# Patient Record
Sex: Female | Born: 1980 | Hispanic: Yes | Marital: Single | Smoking: Never smoker
Health system: Southern US, Community
[De-identification: ages and names within clinical notes are randomized; demographics above are authoritative.]

## PROBLEM LIST (undated history)

## (undated) DIAGNOSIS — I82419 Acute embolism and thrombosis of unspecified femoral vein: Secondary | ICD-10-CM

## (undated) DIAGNOSIS — I82602 Acute embolism and thrombosis of unspecified veins of left upper extremity: Secondary | ICD-10-CM

## (undated) HISTORY — PX: ABDOMINOPLASTY: SUR9

## (undated) HISTORY — PX: BACK SURGERY: SHX140

---

## 2020-01-25 ENCOUNTER — Emergency Department (HOSPITAL_COMMUNITY)
Admission: EM | Admit: 2020-01-25 | Discharge: 2020-01-25 | Disposition: A | Discharge status: Home / Self Care | Payer: Self-pay | Attending: Emergency Medicine | Admitting: Emergency Medicine

## 2020-01-25 ENCOUNTER — Encounter (HOSPITAL_COMMUNITY): Payer: Self-pay

## 2020-01-25 ENCOUNTER — Emergency Department (HOSPITAL_COMMUNITY): Payer: Self-pay

## 2020-01-25 ENCOUNTER — Other Ambulatory Visit: Payer: Self-pay

## 2020-01-25 DIAGNOSIS — R109 Unspecified abdominal pain: Secondary | ICD-10-CM | POA: Insufficient documentation

## 2020-01-25 DIAGNOSIS — Z91040 Latex allergy status: Secondary | ICD-10-CM | POA: Insufficient documentation

## 2020-01-25 DIAGNOSIS — Z20822 Contact with and (suspected) exposure to covid-19: Secondary | ICD-10-CM | POA: Insufficient documentation

## 2020-01-25 DIAGNOSIS — R112 Nausea with vomiting, unspecified: Secondary | ICD-10-CM | POA: Insufficient documentation

## 2020-01-25 DIAGNOSIS — R52 Pain, unspecified: Secondary | ICD-10-CM

## 2020-01-25 DIAGNOSIS — R5383 Other fatigue: Secondary | ICD-10-CM | POA: Insufficient documentation

## 2020-01-25 DIAGNOSIS — R6883 Chills (without fever): Secondary | ICD-10-CM | POA: Insufficient documentation

## 2020-01-25 LAB — CBC
HCT: 38.6 % (ref 36.0–46.0)
Hemoglobin: 12.3 g/dL (ref 12.0–15.0)
MCH: 29.1 pg (ref 26.0–34.0)
MCHC: 31.9 g/dL (ref 30.0–36.0)
MCV: 91.5 fL (ref 80.0–100.0)
Platelets: 241 10*3/uL (ref 150–400)
RBC: 4.22 MIL/uL (ref 3.87–5.11)
RDW: 13.9 % (ref 11.5–15.5)
WBC: 3.6 10*3/uL — ABNORMAL LOW (ref 4.0–10.5)
nRBC: 0 % (ref 0.0–0.2)

## 2020-01-25 LAB — URINALYSIS, ROUTINE W REFLEX MICROSCOPIC
Bacteria, UA: NONE SEEN
Bilirubin Urine: NEGATIVE
Glucose, UA: NEGATIVE mg/dL
Ketones, ur: NEGATIVE mg/dL
Leukocytes,Ua: NEGATIVE
Nitrite: NEGATIVE
Protein, ur: 100 mg/dL — AB
RBC / HPF: >50 RBC/hpf — ABNORMAL HIGH (ref 0–5)
Specific Gravity, Urine: 1.024 (ref 1.005–1.030)
pH: 6 (ref 5.0–8.0)

## 2020-01-25 LAB — COMPREHENSIVE METABOLIC PANEL
ALT: 20 U/L (ref 0–44)
AST: 24 U/L (ref 15–41)
Albumin: 3.7 g/dL (ref 3.5–5.0)
Alkaline Phosphatase: 77 U/L (ref 38–126)
Anion gap: 11 (ref 5–15)
BUN: 16 mg/dL (ref 6–20)
CO2: 24 mmol/L (ref 22–32)
Calcium: 9.2 mg/dL (ref 8.9–10.3)
Chloride: 104 mmol/L (ref 98–111)
Creatinine, Ser: 0.86 mg/dL (ref 0.44–1.00)
GFR, Estimated: >60 mL/min (ref >60)
Glucose, Bld: 131 mg/dL — ABNORMAL HIGH (ref 70–99)
Potassium: 4.1 mmol/L (ref 3.5–5.1)
Sodium: 139 mmol/L (ref 135–145)
Total Bilirubin: 0.7 mg/dL (ref 0.3–1.2)
Total Protein: 7 g/dL (ref 6.5–8.1)

## 2020-01-25 LAB — I-STAT BETA HCG BLOOD, ED (MC, WL, AP ONLY): I-stat hCG, quantitative: <5 m[IU]/mL (ref <5)

## 2020-01-25 LAB — RESPIRATORY PANEL BY RT PCR (FLU A&B, COVID)
Influenza A by PCR: NEGATIVE
Influenza B by PCR: NEGATIVE
SARS Coronavirus 2 by RT PCR: NEGATIVE

## 2020-01-25 MED ORDER — HYDROMORPHONE HCL 1 MG/ML IJ SOLN
1.0000 mg | Freq: Once | INTRAMUSCULAR | Status: AC
  Administered 2020-01-25: 1 mg via INTRAVENOUS
  Filled 2020-01-25: qty 1

## 2020-01-25 MED ORDER — SODIUM CHLORIDE 0.9 % IV BOLUS
1000.0000 mL | Freq: Once | INTRAVENOUS | Status: AC
  Administered 2020-01-25: 1000 mL via INTRAVENOUS

## 2020-01-25 MED ORDER — OXYCODONE-ACETAMINOPHEN 5-325 MG PO TABS
1.0000 | ORAL_TABLET | Freq: Once | ORAL | Status: AC
  Administered 2020-01-25: 1 via ORAL
  Filled 2020-01-25: qty 1

## 2020-01-25 MED ORDER — MORPHINE SULFATE (PF) 4 MG/ML IV SOLN
4.0000 mg | Freq: Once | INTRAVENOUS | Status: DC
  Filled 2020-01-25: qty 1

## 2020-01-25 MED ORDER — PROMETHAZINE HCL 25 MG PO TABS: 25.0000 mg | ORAL_TABLET | Freq: Four times a day (QID) | ORAL | 0 refills | Status: DC | PRN

## 2020-01-25 MED ORDER — DIPHENHYDRAMINE HCL 50 MG/ML IJ SOLN
INTRAMUSCULAR | Status: AC
  Filled 2020-01-25: qty 1

## 2020-01-25 MED ORDER — MORPHINE SULFATE (PF) 4 MG/ML IV SOLN
4.0000 mg | Freq: Once | INTRAVENOUS | Status: AC
  Administered 2020-01-25: 4 mg via INTRAVENOUS

## 2020-01-25 MED ORDER — DROPERIDOL 2.5 MG/ML IJ SOLN
1.2500 mg | Freq: Once | INTRAMUSCULAR | Status: DC
  Filled 2020-01-25 (×2): qty 2

## 2020-01-25 MED ORDER — PROMETHAZINE HCL 25 MG/ML IJ SOLN
12.5000 mg | Freq: Once | INTRAMUSCULAR | Status: AC
  Administered 2020-01-25: 12.5 mg via INTRAVENOUS
  Filled 2020-01-25: qty 1

## 2020-01-25 MED ORDER — ONDANSETRON 4 MG PO TBDP
4.0000 mg | ORAL_TABLET | Freq: Once | ORAL | Status: AC
  Administered 2020-01-25: 4 mg via ORAL
  Filled 2020-01-25: qty 1

## 2020-01-25 MED ORDER — DIPHENHYDRAMINE HCL 50 MG/ML IJ SOLN
25.0000 mg | Freq: Once | INTRAMUSCULAR | Status: AC
  Administered 2020-01-25: 25 mg via INTRAVENOUS

## 2020-01-25 MED ORDER — HYDROMORPHONE HCL 1 MG/ML IJ SOLN
0.5000 mg | Freq: Once | INTRAMUSCULAR | Status: AC
  Administered 2020-01-25: 0.5 mg via INTRAVENOUS
  Filled 2020-01-25: qty 1

## 2020-01-25 MED ORDER — FENTANYL CITRATE (PF) 100 MCG/2ML IJ SOLN
50.0000 ug | Freq: Once | INTRAMUSCULAR | Status: AC
  Administered 2020-01-25: 50 ug via INTRAVENOUS
  Filled 2020-01-25: qty 2

## 2020-01-25 MED ORDER — DIPHENHYDRAMINE HCL 50 MG/ML IJ SOLN
12.5000 mg | Freq: Once | INTRAMUSCULAR | Status: AC
  Administered 2020-01-25: 12.5 mg via INTRAVENOUS
  Filled 2020-01-25: qty 1

## 2020-01-25 MED ORDER — ONDANSETRON HCL 4 MG/2ML IJ SOLN
4.0000 mg | Freq: Once | INTRAMUSCULAR | Status: AC
  Administered 2020-01-25: 4 mg via INTRAVENOUS
  Filled 2020-01-25: qty 2

## 2020-01-25 NOTE — ED Notes (Addendum)
Pt noted to have taken off her BP cuff again. BP cuff placed back on arm. Educated pt on need to keep BP cuff on to appropriately monitor pt

## 2020-01-25 NOTE — ED Notes (Signed)
Pt transported to US

## 2020-01-25 NOTE — ED Notes (Signed)
Pt transported to CT ?

## 2020-01-25 NOTE — ED Provider Notes (Signed)
[facility] EMERGENCY DEPARTMENT Provider Note   CSN: [id number] Arrival date & time: 01/25/20  1353     History Chief Complaint  Patient presents with  . Flank Pain  . Nausea  . Emesis    Amanda Bradley is a 39 y.o. female.  Presents to ER with concern for flank pain.  Patient reports that yesterday she noted some chills, fatigue but thought this was related to receiving her Covid vaccine yesterday.  This morning she woke up with some right flank pain, seems to have been steadily progressing throughout the day.  Pain is intermittent, no alleviating or aggravating factors.  Radiates from right flank to right lower abdomen.  Also noted some blood in her urine, increased frequency and urgency.  No dysuria.  Also having multiple episodes of vomiting, nonbloody nonbilious.  No fevers.  Denies prior abdominal surgery.  No vaginal discharge, vaginal bleeding.  No longer has regular periods.  HPI     History reviewed. No pertinent past medical history.  There are no problems to display for this patient.   Past Surgical History:  Procedure Laterality Date  . BACK SURGERY       OB History   No obstetric history on file.     No family history on file.  Social History   Tobacco Use  . Smoking status: Never Smoker  . Smokeless tobacco: Never Used  Substance Use Topics  . Alcohol use: Never  . Drug use: Never    Home Medications Prior to Admission medications   Medication Sig Start Date End Date Taking? Authorizing Provider  promethazine (PHENERGAN) 25 MG tablet Take 1 tablet (25 mg total) by mouth every 6 (six) hours as needed for nausea or vomiting. 01/25/20   Milagros Loll, MD    Allergies    Latex, Nsaids, and Other  Review of Systems   Review of Systems  Constitutional: Negative for chills and fever.  HENT: Negative for ear pain and sore throat.   Eyes: Negative for pain and visual disturbance.  Respiratory: Negative for cough and shortness  of breath.   Cardiovascular: Negative for chest pain and palpitations.  Gastrointestinal: Positive for abdominal pain. Negative for vomiting.  Genitourinary: Positive for flank pain and frequency. Negative for dysuria and hematuria.  Musculoskeletal: Negative for arthralgias and back pain.  Skin: Negative for color change and rash.  Neurological: Negative for seizures and syncope.  All other systems reviewed and are negative.   Physical Exam Updated Vital Signs BP 124/71   Pulse (!) 103   Temp 99.3 F (37.4 C) (Oral)   Resp 18   Ht 5\' 7"  (1.702 m)   Wt 90.7 kg   SpO2 96%   BMI 31.32 kg/m   Physical Exam Vitals and nursing note reviewed.  Constitutional:      General: She is not in acute distress.    Appearance: She is well-developed.     Comments: Appears uncomfortable secondary to pain  HENT:     Head: Normocephalic and atraumatic.  Eyes:     Conjunctiva/sclera: Conjunctivae normal.  Cardiovascular:     Rate and Rhythm: Normal rate and regular rhythm.     Heart sounds: No murmur heard.   Pulmonary:     Effort: Pulmonary effort is normal. No respiratory distress.     Breath sounds: Normal breath sounds.  Abdominal:     Palpations: Abdomen is soft.     Tenderness: There is no abdominal tenderness.  Musculoskeletal:  Cervical back: Neck supple.  Skin:    General: Skin is warm and dry.  Neurological:     Mental Status: She is alert.     ED Results / Procedures / Treatments   Labs (all labs ordered are listed, but only abnormal results are displayed) Labs Reviewed  COMPREHENSIVE METABOLIC PANEL - Abnormal; Notable for the following components:      Result Value   Glucose, Bld 131 (*)    All other components within normal limits  CBC - Abnormal; Notable for the following components:   WBC 3.6 (*)    All other components within normal limits  URINALYSIS, ROUTINE W REFLEX MICROSCOPIC - Abnormal; Notable for the following components:   Color, Urine AMBER (*)     APPearance CLOUDY (*)    Hgb urine dipstick MODERATE (*)    Protein, ur 100 (*)    RBC / HPF >50 (*)    All other components within normal limits  RESPIRATORY PANEL BY RT PCR (FLU A&B, COVID)  I-STAT BETA HCG BLOOD, ED ([facility], [facility], [facility] ONLY)    EKG None  Radiology CT Renal Stone Study  Result Date: 01/25/2020 CLINICAL DATA:  Right flank pain EXAM: CT ABDOMEN AND PELVIS WITHOUT CONTRAST TECHNIQUE: Multidetector CT imaging of the abdomen and pelvis was performed following the standard protocol without IV contrast. COMPARISON:  None. FINDINGS: Lower chest: No acute abnormality. Hepatobiliary: No focal liver abnormality is seen. No gallstones, gallbladder wall thickening, or biliary dilatation. Pancreas: Unremarkable. Spleen: Unremarkable. Adrenals/Urinary Tract: Adrenals, kidneys, and bladder are unremarkable. Stomach/Bowel: Stomach is within normal limits. Bowel is normal in caliber. Normal appendix. Vascular/Lymphatic: No significant vascular findings on this noncontrast study. No enlarged lymph nodes identified. Reproductive: Uterus and bilateral adnexa are unremarkable. Other: No ascites.  Abdominal wall is unremarkable. Musculoskeletal: No significant or acute osseous abnormality. IMPRESSION: No acute abnormality or findings to account for reported symptoms. Electronically Signed   By: Guadlupe Spanish M.D.   On: 01/25/2020 18:20   US PELVIC COMPLETE W TRANSVAGINAL AND TORSION R/O  Result Date: 01/25/2020 CLINICAL DATA:  Right-sided pain. EXAM: TRANSABDOMINAL AND TRANSVAGINAL ULTRASOUND OF PELVIS DOPPLER ULTRASOUND OF OVARIES TECHNIQUE: Both transabdominal and transvaginal ultrasound examinations of the pelvis were performed. Transabdominal technique was performed for global imaging of the pelvis including uterus, ovaries, adnexal regions, and pelvic cul-de-sac. It was necessary to proceed with endovaginal exam following the transabdominal exam to visualize the uterus, ovaries, and adnexa. Color  and duplex Doppler ultrasound was utilized to evaluate blood flow to the ovaries. COMPARISON:  Noncontrast abdominopelvic CT earlier today. FINDINGS: Uterus Measurements: 7.3 x 3.1 x 4.2 cm = volume: 51 mL. No fibroids or other mass visualized. Endometrium Thickness: 6 mm, normal.  No focal abnormality visualized. Right ovary Measurements: 1.6 x 1.3 x 1.4 cm = volume: 1.4 mL. Normal appearance/no adnexal mass. Normal blood flow. Left ovary Measurements: 1.4 x 1.0 x 1.4 cm = volume: 1.1 mL. Normal appearance/no adnexal mass. Normal blood flow. Pulsed Doppler evaluation of both ovaries demonstrates normal low-resistance arterial and venous waveforms. Other findings No abnormal free fluid. IMPRESSION: Normal pelvic ultrasound with Doppler. Electronically Signed   By: Narda Rutherford M.D.   On: 01/25/2020 21:46    Procedures Procedures (including critical care time)  Medications Ordered in ED Medications  morphine 4 MG/ML injection 4 mg (0 mg Intravenous Hold 01/25/20 1714)  droperidol (INAPSINE) 2.5 MG/ML injection 1.25 mg (1.25 mg Intravenous Refused 01/25/20 1854)  fentaNYL (SUBLIMAZE) injection 50 mcg (has no  administration in time range)  oxyCODONE-acetaminophen (PERCOCET/ROXICET) 5-325 MG per tablet 1 tablet (1 tablet Oral Given 01/25/20 1457)  ondansetron (ZOFRAN-ODT) disintegrating tablet 4 mg (4 mg Oral Given 01/25/20 1457)  ondansetron (ZOFRAN) injection 4 mg (4 mg Intravenous Given 01/25/20 1705)  morphine 4 MG/ML injection 4 mg (4 mg Intravenous Given 01/25/20 1705)  diphenhydrAMINE (BENADRYL) injection 25 mg (0 mg Intravenous Not Given 01/25/20 1728)  HYDROmorphone (DILAUDID) injection 1 mg (1 mg Intravenous Given 01/25/20 2011)  sodium chloride 0.9 % bolus 1,000 mL (1,000 mLs Intravenous New Bag/Given 01/25/20 2010)  promethazine (PHENERGAN) injection 12.5 mg (12.5 mg Intravenous Given 01/25/20 2040)  HYDROmorphone (DILAUDID) injection 0.5 mg (0.5 mg Intravenous Given 01/25/20 2042)    ED  Course  I have reviewed the triage vital signs and the nursing notes.  Pertinent labs & imaging results that were available during my care of the patient were reviewed by me and considered in my medical decision making (see chart for details).    MDM Rules/Calculators/A&P                          39 year old lady presenting to the emergency room with concern for right flank pain.  On exam, patient noted to have significant pain.  Her urinalysis was concerning for some hematuria but no infection.  Her lab work was grossly stable.  CT renal was negative for nephrolithiasis, normal-appearing appendix.  Given the ongoing pain, nausea, checked TVUS, which was negative for torsion, TOA, cysts or other acute gynecologic pathology.  On reassessment, her symptoms had improved but she still had some lingering pain.  Given the reassuring work-up, patient's overall well appearance, believe she can be discharged and managed in the outpatient setting.  Given the severity of her pain on presentation here, I recommended that she come back to ER tomorrow for a recheck.    After the discussed management above, the patient was determined to be safe for discharge.  The patient was in agreement with this plan and all questions regarding their care were answered.  ED return precautions were discussed and the patient will return to the ED with any significant worsening of condition.   Final Clinical Impression(s) / ED Diagnoses Final diagnoses:  Flank pain    Rx / DC Orders ED Discharge Orders         Ordered    promethazine (PHENERGAN) 25 MG tablet  Every 6 hours PRN        01/25/20 2200           Milagros Loll, MD 01/25/20 2213

## 2020-01-25 NOTE — ED Notes (Signed)
Pt is stable. VS frequency changed to q92min

## 2020-01-25 NOTE — ED Triage Notes (Signed)
Pt presents with Right flank pain, frequency and urgency, blood in her urine, nausea and vomiting starting this am. Denies hx of kidney stone

## 2020-01-25 NOTE — ED Notes (Signed)
Reviewed discharge instructions with patient. Follow-up care and medications reviewed. Patient  verbalized understanding. Patient A&Ox4, VSS, and ambulatory with steady gait upon discharge.  °

## 2020-01-25 NOTE — ED Notes (Signed)
Pt repeatedly removed BP cuff. Unable to obtain VS according to acuity d/t pt uncooperativeness and despite nursing education.

## 2020-01-25 NOTE — Discharge Instructions (Addendum)
Please return to ER for recheck tomorrow.  Take the prescribed nausea medicine as needed.  If the pain worsens, vomiting worsens, you develop fever or other new concerning symptom, recommend returning to ER at that time.

## 2020-01-25 NOTE — ED Notes (Signed)
Pt unhooked her own IV, educated pt that she doesn't need to do that, that she needs to let medical staff do that

## 2020-01-25 NOTE — ED Notes (Signed)
Pt noted to have removed pulse ox and BP cuff; Replaced both

## 2020-01-25 NOTE — ED Notes (Signed)
Pt endorsing itching after fentanyl administration and claims to have hives popping up. No hives noted. Provider notified

## 2020-02-02 ENCOUNTER — Emergency Department (HOSPITAL_COMMUNITY): Payer: Self-pay

## 2020-02-02 ENCOUNTER — Emergency Department (HOSPITAL_COMMUNITY)
Admission: EM | Admit: 2020-02-02 | Discharge: 2020-02-03 | Disposition: A | Discharge status: Home / Self Care | Payer: Self-pay | Attending: Emergency Medicine | Admitting: Emergency Medicine

## 2020-02-02 ENCOUNTER — Other Ambulatory Visit: Payer: Self-pay

## 2020-02-02 ENCOUNTER — Encounter (HOSPITAL_COMMUNITY): Payer: Self-pay | Admitting: Emergency Medicine

## 2020-02-02 DIAGNOSIS — R109 Unspecified abdominal pain: Secondary | ICD-10-CM

## 2020-02-02 DIAGNOSIS — R319 Hematuria, unspecified: Secondary | ICD-10-CM

## 2020-02-02 LAB — URINALYSIS, ROUTINE W REFLEX MICROSCOPIC
Bacteria, UA: NONE SEEN
Bilirubin Urine: NEGATIVE
Glucose, UA: NEGATIVE mg/dL
Ketones, ur: NEGATIVE mg/dL
Leukocytes,Ua: NEGATIVE
Nitrite: NEGATIVE
Protein, ur: 30 mg/dL — AB
RBC / HPF: >50 RBC/hpf — ABNORMAL HIGH (ref 0–5)
Specific Gravity, Urine: 1.021 (ref 1.005–1.030)
pH: 7 (ref 5.0–8.0)

## 2020-02-02 LAB — COMPREHENSIVE METABOLIC PANEL
ALT: 34 U/L (ref 0–44)
AST: 30 U/L (ref 15–41)
Albumin: 4 g/dL (ref 3.5–5.0)
Alkaline Phosphatase: 87 U/L (ref 38–126)
Anion gap: 12 (ref 5–15)
BUN: 14 mg/dL (ref 6–20)
CO2: 21 mmol/L — ABNORMAL LOW (ref 22–32)
Calcium: 9.6 mg/dL (ref 8.9–10.3)
Chloride: 109 mmol/L (ref 98–111)
Creatinine, Ser: 0.84 mg/dL (ref 0.44–1.00)
GFR, Estimated: >60 mL/min (ref >60)
Glucose, Bld: 85 mg/dL (ref 70–99)
Potassium: 3.5 mmol/L (ref 3.5–5.1)
Sodium: 142 mmol/L (ref 135–145)
Total Bilirubin: 0.5 mg/dL (ref 0.3–1.2)
Total Protein: 7.8 g/dL (ref 6.5–8.1)

## 2020-02-02 LAB — CBC
HCT: 45.2 % (ref 36.0–46.0)
Hemoglobin: 14.1 g/dL (ref 12.0–15.0)
MCH: 29.6 pg (ref 26.0–34.0)
MCHC: 31.2 g/dL (ref 30.0–36.0)
MCV: 94.8 fL (ref 80.0–100.0)
Platelets: 245 10*3/uL (ref 150–400)
RBC: 4.77 MIL/uL (ref 3.87–5.11)
RDW: 13.9 % (ref 11.5–15.5)
WBC: 3.2 10*3/uL — ABNORMAL LOW (ref 4.0–10.5)
nRBC: 0 % (ref 0.0–0.2)

## 2020-02-02 MED ORDER — IOHEXOL 300 MG/ML  SOLN
100.0000 mL | Freq: Once | INTRAMUSCULAR | Status: AC | PRN
  Administered 2020-02-02: 100 mL via INTRAVENOUS

## 2020-02-02 MED ORDER — HYDROMORPHONE HCL 1 MG/ML IJ SOLN
1.0000 mg | Freq: Once | INTRAMUSCULAR | Status: AC
  Administered 2020-02-02: 1 mg via INTRAVENOUS
  Filled 2020-02-02: qty 1

## 2020-02-02 MED ORDER — HYDROMORPHONE HCL 1 MG/ML IJ SOLN
2.0000 mg | Freq: Once | INTRAMUSCULAR | Status: AC
  Administered 2020-02-02: 2 mg via INTRAMUSCULAR
  Filled 2020-02-02: qty 2

## 2020-02-02 MED ORDER — IOHEXOL 350 MG/ML SOLN: 75.0000 mL | Freq: Once | INTRAVENOUS | Status: DC | PRN

## 2020-02-02 MED ORDER — ONDANSETRON HCL 4 MG/2ML IJ SOLN
4.0000 mg | Freq: Once | INTRAMUSCULAR | Status: AC
  Administered 2020-02-02: 4 mg via INTRAVENOUS
  Filled 2020-02-02: qty 2

## 2020-02-02 MED ORDER — PROMETHAZINE HCL 25 MG/ML IJ SOLN
12.5000 mg | Freq: Once | INTRAMUSCULAR | Status: AC
  Administered 2020-02-02: 12.5 mg via INTRAVENOUS
  Filled 2020-02-02: qty 1

## 2020-02-02 MED ORDER — DIPHENHYDRAMINE HCL 50 MG/ML IJ SOLN
25.0000 mg | Freq: Once | INTRAMUSCULAR | Status: AC
  Administered 2020-02-02: 25 mg via INTRAVENOUS
  Filled 2020-02-02: qty 1

## 2020-02-02 MED ORDER — SODIUM CHLORIDE 0.9 % IV SOLN: INTRAVENOUS | Status: DC

## 2020-02-02 MED ORDER — SODIUM CHLORIDE 0.9 % IV BOLUS
1000.0000 mL | Freq: Once | INTRAVENOUS | Status: AC
  Administered 2020-02-02: 1000 mL via INTRAVENOUS

## 2020-02-02 NOTE — ED Notes (Signed)
Pt asked previous RN for more pain and nausea meds, previous RN went to ask MD. Pt called out again asking for pain meds, when attempting to tell pt that someone was asking the MD, pt became very frustrated and raised voice saying "I'm in pain, I've been waiting, you seem flustered and if you don't want to be my nurse, then find someone else". This RN attempted to tell pt I would ask MD again and to please not speak to staff like that.

## 2020-02-02 NOTE — ED Notes (Signed)
MD made aware that pt is refusing CT d/t pain and nausea

## 2020-02-02 NOTE — ED Notes (Signed)
Patient transported to CT 

## 2020-02-02 NOTE — ED Notes (Signed)
Patient now refusing to go to CT due to pain and nausea.

## 2020-02-02 NOTE — ED Triage Notes (Signed)
Pt here back from home with contiuned right side flank pain , pt was here 1 week ago for same had CT and [place]

## 2020-02-02 NOTE — ED Provider Notes (Addendum)
[facility] EMERGENCY DEPARTMENT Provider Note   CSN: [id number] Arrival date & time: 02/02/20  1317     History No chief complaint on file.   Amanda Bradley is a 39 y.o. female.  Patient presenting with acute and persistent right CVA right flank pain radiating into the right groin area that started about a week ago.  Patient was seen November 2 for this.  Had an extensive work-up at that time to include CT renal study.  And ultrasound to rule out ovarian torsion all negative.  Urinalysis significant just for hematuria.  But no other signs of infection.  CT scan had no acute findings.  Patient urine was not sent for culture.  Patient not started on antibiotics.  Patient not given any follow-up.  Returns now stating that she never got any better she has had persistent pain ever since.  I reviewed during her last ED work-up there was difficulty in controlling her pain.  Patient denies any chest pain or shortness of breath.  Denies any leg swelling.  Patient states she had persistent pain.  States that she developed a fever 2 days ago this would be new.  No fever here.        History reviewed. No pertinent past medical history.  There are no problems to display for this patient.   Past Surgical History:  Procedure Laterality Date  . BACK SURGERY       OB History   No obstetric history on file.     No family history on file.  Social History   Tobacco Use  . Smoking status: Never Smoker  . Smokeless tobacco: Never Used  Substance Use Topics  . Alcohol use: Never  . Drug use: Never    Home Medications Prior to Admission medications   Medication Sig Start Date End Date Taking? Authorizing Provider  promethazine (PHENERGAN) 25 MG tablet Take 1 tablet (25 mg total) by mouth every 6 (six) hours as needed for nausea or vomiting. 01/25/20   Milagros Loll, MD    Allergies    Fentanyl, Latex, Nsaids, Other, and Zofran [ondansetron]  Review of Systems    Review of Systems  Constitutional: Negative for chills and fever.  HENT: Negative for congestion, rhinorrhea and sore throat.   Eyes: Negative for visual disturbance.  Respiratory: Negative for cough and shortness of breath.   Cardiovascular: Negative for chest pain and leg swelling.  Gastrointestinal: Positive for nausea. Negative for abdominal pain, diarrhea and vomiting.  Genitourinary: Positive for flank pain. Negative for dysuria.  Musculoskeletal: Positive for back pain. Negative for neck pain.  Skin: Negative for rash.  Neurological: Negative for dizziness, light-headedness and headaches.  Hematological: Does not bruise/bleed easily.  Psychiatric/Behavioral: Negative for confusion.    Physical Exam Updated Vital Signs BP 125/89   Pulse 86   Temp 98.3 F (36.8 C) (Oral)   Resp 18   Ht 1.702 m (5\' 7" )   Wt 90.7 kg   SpO2 100%   BMI 31.32 kg/m   Physical Exam Vitals and nursing note reviewed.  Constitutional:      General: She is not in acute distress.    Appearance: She is well-developed. She is not ill-appearing.  HENT:     Head: Normocephalic and atraumatic.  Eyes:     Extraocular Movements: Extraocular movements intact.     Conjunctiva/sclera: Conjunctivae normal.     Pupils: Pupils are equal, round, and reactive to light.  Cardiovascular:     Rate  and Rhythm: Normal rate and regular rhythm.     Heart sounds: No murmur heard.   Pulmonary:     Effort: Pulmonary effort is normal. No respiratory distress.     Breath sounds: Normal breath sounds.  Abdominal:     Palpations: Abdomen is soft.     Tenderness: There is no abdominal tenderness.  Musculoskeletal:        General: Normal range of motion.     Cervical back: Neck supple.     Right lower leg: No edema.     Left lower leg: No edema.  Skin:    General: Skin is warm and dry.     Capillary Refill: Capillary refill takes less than 2 seconds.  Neurological:     General: No focal deficit present.      Mental Status: She is alert and oriented to person, place, and time.     Cranial Nerves: No cranial nerve deficit.     Sensory: No sensory deficit.     Motor: No weakness.     ED Results / Procedures / Treatments   Labs (all labs ordered are listed, but only abnormal results are displayed) Labs Reviewed  CBC - Abnormal; Notable for the following components:      Result Value   WBC 3.2 (*)    All other components within normal limits  COMPREHENSIVE METABOLIC PANEL - Abnormal; Notable for the following components:   CO2 21 (*)    All other components within normal limits  URINALYSIS, ROUTINE W REFLEX MICROSCOPIC - Abnormal; Notable for the following components:   APPearance CLOUDY (*)    Hgb urine dipstick LARGE (*)    Protein, ur 30 (*)    RBC / HPF >50 (*)    All other components within normal limits  URINE CULTURE    EKG None  Radiology CT Abdomen Pelvis W Contrast  Addendum Date: 02/02/2020   ADDENDUM REPORT: 02/02/2020 19:27 ADDENDUM: These results were called by telephone at the time of interpretation on 02/02/2020 at 7:27 pm to provider Vanetta Mulders , who verbally acknowledged these results. Electronically Signed   By: Katherine Mantle M.D.   On: 02/02/2020 19:27   Result Date: 02/02/2020 CLINICAL DATA:  Hematuria. EXAM: CT ABDOMEN AND PELVIS WITH CONTRAST TECHNIQUE: Multidetector CT imaging of the abdomen and pelvis was performed using the standard protocol following bolus administration of intravenous contrast. CONTRAST:  OMNIPAQUE IOHEXOL 300 MG/ML  SOLN COMPARISON:  CT dated January 25, 2020 FINDINGS: Lower chest: The lung bases are clear. The heart size is normal. Hepatobiliary: The liver is normal. Normal gallbladder.There is no biliary ductal dilation. Pancreas: Normal contours without ductal dilatation. No peripancreatic fluid collection. Spleen: Unremarkable. Adrenals/Urinary Tract: --Adrenal glands: Unremarkable. --Right kidney/ureter: There are small  hypoattenuating nodules throughout the right kidney, too small to characterize on this study but are statistically most likely to represent benign cysts. --Left kidney/ureter: There are small hypoattenuating nodules throughout the left kidney, some of which measure above water density. Statistically these are most likely to represent benign hemorrhagic or proteinaceous cyst. --Urinary bladder: Unremarkable. Stomach/Bowel: --Stomach/Duodenum: No hiatal hernia or other gastric abnormality. Normal duodenal course and caliber. --Small bowel: Unremarkable. --Colon: Unremarkable. --Appendix: Normal. Vascular/Lymphatic: Normal course and caliber of the major abdominal vessels. Findings are concerning for bilateral common femoral vein DVTs. --No retroperitoneal lymphadenopathy. --No mesenteric lymphadenopathy. --No pelvic or inguinal lymphadenopathy. Reproductive: Unremarkable Other: No ascites or free air. There is a small fat containing umbilical hernia. Musculoskeletal. There is  no acute displaced fracture. There is an area of subcutaneous fat stranding involving the low left posterior flank. This is relatively unchanged from prior study and may represent a subcutaneous hematoma or contusion. IMPRESSION: 1. No CT findings to explain the patient's hematuria. Specifically, there is no hydronephrosis or radiopaque kidney stone. 2. Indeterminate left-sided renal nodules favored to represent proteinaceous or hemorrhagic cyst. Follow-up with a nonemergent outpatient ultrasound is recommended. 3. 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 Further evaluation with a dedicated ultrasound is recommended. Electronically Signed: By: Katherine Mantlehristopher  Green M.D. On: 02/02/2020 19:19    Procedures Procedures (including critical care time)  Medications Ordered in ED Medications  0.9 %  sodium chloride infusion ( Intravenous New Bag/Given 02/02/20 2255)  sodium chloride 0.9 % bolus 1,000 mL (0 mLs Intravenous Stopped  02/02/20 2134)  ondansetron (ZOFRAN) injection 4 mg (4 mg Intravenous Given 02/02/20 1758)  HYDROmorphone (DILAUDID) injection 1 mg (1 mg Intravenous Given 02/02/20 1804)  diphenhydrAMINE (BENADRYL) injection 25 mg (25 mg Intravenous Given 02/02/20 1812)  iohexol (OMNIPAQUE) 300 MG/ML solution 100 mL (100 mLs Intravenous Contrast Given 02/02/20 1841)  HYDROmorphone (DILAUDID) injection 1 mg (1 mg Intravenous Given 02/02/20 2013)  HYDROmorphone (DILAUDID) injection 2 mg (2 mg Intramuscular Given 02/02/20 2251)  promethazine (PHENERGAN) injection 12.5 mg (12.5 mg Intravenous Given 02/02/20 2249)    ED Course  I have reviewed the triage vital signs and the nursing notes.  Pertinent labs & imaging results that were available during my care of the patient were reviewed by me and considered in my medical decision making (see chart for details).    MDM Rules/Calculators/A&P                           Patient given hydromorphone Zofran Phenergan to help control the pain.  Repeated CT scan this time with IV contrast.  Which had no acute findings.  Labs without any significant changes.  Urine sent for culture this time.  The CT scan showed the upper part of the chest to be normal.  But raise some concern about a deep vein thrombosis in the left proximal femoral area.  Pulmonary embolus possibly could cause the right CVA tenderness.  No other acute findings on the CT of the abdomen.  So decided to go ahead and do CT angio.  Still waiting for those results.  Patient then went on to develop a headache so decided to add on CT head.  Patient stated headache was severe.  If CT angio does not show anything acute would go ahead probably treat with Lovenox get Doppler studies of both lower extremities outpatient tomorrow or maybe consider admission for this if pain can be controlled well.  For the hematuria would consider may be treating with antibiotics.  Date that she had a fever starting 2 days ago.  But no  documented fever here.  No significant change in white blood cell count.     Final Clinical Impression(s) / ED Diagnoses Final diagnoses:  Hematuria, unspecified type  Acute flank pain    Rx / DC Orders ED Discharge Orders    None       Vanetta MuldersZackowski, Evolet Salminen, MD 02/02/20 2341   Addendum:  Patient is gone on to develop itching.  No true hives.  No tongue swelling no lip swelling.  Radiology will not do the CT angio of the chest as of this.  But clinically as a mention we were just trying  to rule out possible pulmonary embolus.  The main concern is the possibility of bilateral or certainly left-sided proximal DVTs.  We will go ahead and treat with Lovenox.  And have her follow-up with vascular surgery for bilateral lower extremity Dopplers.  For the itching will give Benadryl Pepcid and Solu-Medrol and will discharge patient home.  We will give her referral to urology.  We will not treat with any additional medications other than the continue to take the Benadryl and Pepcid at home.  Patient does have an EpiPen at home.  Patient will follow up with urology urines been cultured.  She will be notified if it is consistent with blood.  So the hematuria will be follow-up by urology.  The concerns for bilateral lower extremity proximal femoral DVTs will be treated with Lovenox and patient will have vascular studies done as an outpatient.  If it shows evidence of DVT should be referred back to the emergency department for appropriate care.  Patient clinically stable.  No fevers here.  Oxygen saturations are good.  Patient nontoxic no acute distress stress.    Vanetta Mulders, MD 02/03/20 [phone]

## 2020-02-02 NOTE — ED Notes (Signed)
Patient transported to CT with patient transport.

## 2020-02-02 NOTE — ED Notes (Signed)
Pt c/o pain and nausea, asking for meds, MD made aware

## 2020-02-02 NOTE — ED Notes (Signed)
MD at bedside. 

## 2020-02-02 NOTE — ED Notes (Signed)
Pt reports she is still in pain and nauseous.

## 2020-02-02 NOTE — ED Notes (Signed)
IV team RN at beside to attempt to new line for CT Angio

## 2020-02-03 MED ORDER — FAMOTIDINE IN NACL 20-0.9 MG/50ML-% IV SOLN
20.0000 mg | Freq: Once | INTRAVENOUS | Status: AC
  Administered 2020-02-03: 20 mg via INTRAVENOUS
  Filled 2020-02-03: qty 50

## 2020-02-03 MED ORDER — FAMOTIDINE 20 MG PO TABS: 20.0000 mg | ORAL_TABLET | Freq: Two times a day (BID) | ORAL | 0 refills | Status: DC

## 2020-02-03 MED ORDER — DIPHENHYDRAMINE HCL 50 MG/ML IJ SOLN
INTRAMUSCULAR | Status: AC
  Administered 2020-02-03: 25 mg via INTRAVENOUS
  Filled 2020-02-03: qty 1

## 2020-02-03 MED ORDER — METHYLPREDNISOLONE SODIUM SUCC 125 MG IJ SOLR
125.0000 mg | Freq: Once | INTRAMUSCULAR | Status: AC
  Administered 2020-02-03: 125 mg via INTRAVENOUS
  Filled 2020-02-03: qty 2

## 2020-02-03 MED ORDER — ENOXAPARIN SODIUM 100 MG/ML ~~LOC~~ SOLN
1.0000 mg/kg | Freq: Once | SUBCUTANEOUS | Status: AC
  Administered 2020-02-03: 90 mg via SUBCUTANEOUS
  Filled 2020-02-03: qty 0.9

## 2020-02-03 MED ORDER — DIPHENHYDRAMINE HCL 50 MG/ML IJ SOLN: 25.0000 mg | Freq: Once | INTRAMUSCULAR | Status: AC

## 2020-02-03 MED ORDER — DIPHENHYDRAMINE HCL 25 MG PO TABS: 25.0000 mg | ORAL_TABLET | Freq: Four times a day (QID) | ORAL | 0 refills | Status: DC

## 2020-02-03 NOTE — Discharge Instructions (Signed)
For the allergic reaction from the medication you received today continue to take Benadryl 25 mg every 6 hours.  Take Pepcid 20 mg twice a day.  Take the Benadryl at least for the next 48 hours continue take the Pepcid for the next 7 days.  For the hematuria you have been given referral to urology call and make an appointment.  Urine's been sent for culture.  To be contacted if it grows bacteria.  For the concern for possible blood clots in both upper lower extremities you will be contacted by the vascular lab for Doppler studies of both legs tomorrow.  You have been given a shot of Lovenox here to help prevent any complications from the blood clots if they exist.

## 2020-02-03 NOTE — ED Provider Notes (Signed)
Contacted patient at home based on cell phone number provided.  Left a message.  Noted patient had not had Doppler studies done by vascular service.  Had ordered bilateral lower extremity Dopplers.  Contacted patient and let her know she is having difficulty getting that done or if she was not contacted by them them at [facility] long hospital today and that they would be available to do it here up until 7 PM.  Left callback number for patient.  Patient has not called back yet.   Vanetta Mulders, MD 02/03/20 1710

## 2020-02-04 LAB — URINE CULTURE: Culture: <10000 — AB

## 2020-02-22 ENCOUNTER — Telehealth (HOSPITAL_COMMUNITY): Payer: Self-pay | Admitting: Emergency Medicine

## 2020-02-22 NOTE — Telephone Encounter (Signed)
  9:50 AM Patient called provider phone stating she received a message from Dr. Deretha Emory.  Apparently the message stated patient was to call back especially if her symptoms were worsening. She was reportedly seen for flank pain and gross hematuria, among other complaints.  She was advised to follow-up with a urologist, but has not done so citing insurance concerns. She was also advised to undergo outpatient lower extremity duplex ultrasound to rule out DVT, but did not do this either.  She states she is having frank hematuria, worsened flank pain, and now fever.  Due to the patient's worsening symptoms and the addition of fever, she was advised to return to the emergency department.

## 2020-05-27 ENCOUNTER — Emergency Department (HOSPITAL_COMMUNITY)
Admission: EM | Admit: 2020-05-27 | Discharge: 2020-05-27 | Discharge status: Against Medical Advice | Payer: Self-pay | Attending: Emergency Medicine | Admitting: Emergency Medicine

## 2020-05-27 ENCOUNTER — Encounter (HOSPITAL_COMMUNITY): Payer: Self-pay

## 2020-05-27 DIAGNOSIS — M546 Pain in thoracic spine: Secondary | ICD-10-CM | POA: Insufficient documentation

## 2020-05-27 DIAGNOSIS — R350 Frequency of micturition: Secondary | ICD-10-CM | POA: Insufficient documentation

## 2020-05-27 DIAGNOSIS — R300 Dysuria: Secondary | ICD-10-CM | POA: Insufficient documentation

## 2020-05-27 DIAGNOSIS — Z5321 Procedure and treatment not carried out due to patient leaving prior to being seen by health care provider: Secondary | ICD-10-CM

## 2020-05-27 DIAGNOSIS — Z91040 Latex allergy status: Secondary | ICD-10-CM | POA: Insufficient documentation

## 2020-05-27 DIAGNOSIS — R1031 Right lower quadrant pain: Secondary | ICD-10-CM | POA: Insufficient documentation

## 2020-05-27 HISTORY — DX: Acute embolism and thrombosis of unspecified veins of left upper extremity: I82.602

## 2020-05-27 HISTORY — DX: Acute embolism and thrombosis of unspecified femoral vein: I82.419

## 2020-05-27 MED ORDER — PROCHLORPERAZINE EDISYLATE 10 MG/2ML IJ SOLN
10.0000 mg | Freq: Once | INTRAMUSCULAR | Status: DC
  Filled 2020-05-27: qty 2

## 2020-05-27 MED ORDER — OXYCODONE-ACETAMINOPHEN 5-325 MG PO TABS
1.0000 | ORAL_TABLET | Freq: Once | ORAL | Status: DC
  Filled 2020-05-27: qty 1

## 2020-05-27 NOTE — ED Notes (Signed)
Pt refusing meds, labs and states unable to give urine sample at this time, pt upset and requesting a different doctor. States that she will come back later, does not want PO or IM meds but also does not want an IV, requests CT scan as well

## 2020-05-27 NOTE — ED Triage Notes (Signed)
Pt reports that Tuesday she started going in/out of the bathroom frequently. Has started noticing blood in urine, right flank that radiates the right lower quadrant, and vomiting.

## 2020-05-27 NOTE — ED Notes (Signed)
Unable to obtain lab specimens due to patient requesting  a different a different area when an appropriate vein was identified. Unable to use left arm due to blood clot. Pt states that she is unable to provide urine specimen at this time because she urinated prior to arrival.

## 2020-05-27 NOTE — ED Provider Notes (Signed)
[facility] EMERGENCY DEPARTMENT Provider Note   CSN: [id number] Arrival date & time: 05/27/20  0411     History Chief Complaint  Patient presents with  . Urinary Frequency  . Vomiting  . Flank Pain    Amanda Bradley is a 40 y.o. female.  40 year old female here with right side abdominal pain.  Patient states it seems to be in her right lower quadrant and up in her back.  She states it was similar to when she was here with "pyelonephritis" last time.  She states that she might of had a "kidney stone the past" last time.  She states that happened twice back in November.  But then she had to go to [place].  She states that she has some CT scans done there for similar situations in never found to have any abnormalities.  Patient states that has had some hematuria recently and dysuria.  She states that she cannot provide a urine sample this time so she went prior to coming even though she really is significant frequency and urgency over the last couple days.  Denies any fever, nausea or vomiting.  On review of the records patient was here last time very noncompliant with medical work-up.  She has had 2 CT scans in November and then she relays the multiple CT scans overseas.  Also refused to get blood work drawn in triage.   Urinary Frequency  Flank Pain       Past Medical History:  Diagnosis Date  . Arm vein blood clot, left   . Dvt femoral (deep venous thrombosis) (HCC)     There are no problems to display for this patient.   Past Surgical History:  Procedure Laterality Date  . BACK SURGERY       OB History   No obstetric history on file.     History reviewed. No pertinent family history.  Social History   Tobacco Use  . Smoking status: Never Smoker  . Smokeless tobacco: Never Used  Vaping Use  . Vaping Use: Never used  Substance Use Topics  . Alcohol use: Never  . Drug use: Never    Home Medications Prior to Admission medications    Medication Sig Start Date End Date Taking? Authorizing Provider  diphenhydrAMINE (BENADRYL) 25 MG tablet Take 1 tablet (25 mg total) by mouth every 6 (six) hours. 02/03/20   Vanetta Mulders, MD  famotidine (PEPCID) 20 MG tablet Take 1 tablet (20 mg total) by mouth 2 (two) times daily. 02/03/20   Vanetta Mulders, MD  promethazine (PHENERGAN) 25 MG tablet Take 1 tablet (25 mg total) by mouth every 6 (six) hours as needed for nausea or vomiting. 01/25/20   Milagros Loll, MD    Allergies    Fentanyl, Latex, Nsaids, Other, Phenergan [promethazine], and Zofran [ondansetron]  Review of Systems   Review of Systems  Genitourinary: Positive for flank pain and frequency.  All other systems reviewed and are negative.   Physical Exam Updated Vital Signs BP 117/73 (BP Location: Right Arm)   Pulse 92   Temp 98 F (36.7 C) (Oral)   Resp (!) 22   Ht 5\' 7"  (1.702 m)   Wt 95.3 kg   SpO2 100%   BMI 32.89 kg/m   Physical Exam Vitals and nursing note reviewed.  Constitutional:      Appearance: She is well-developed and well-nourished.  HENT:     Head: Normocephalic and atraumatic.     Nose: Nose normal. No  congestion or rhinorrhea.     Mouth/Throat:     Mouth: Mucous membranes are moist.     Pharynx: Oropharynx is clear.  Eyes:     Pupils: Pupils are equal, round, and reactive to light.  Cardiovascular:     Rate and Rhythm: Normal rate and regular rhythm.  Pulmonary:     Effort: No respiratory distress.     Breath sounds: No stridor.  Abdominal:     General: Abdomen is flat. There is no distension.  Musculoskeletal:        General: No swelling or tenderness. Normal range of motion.     Cervical back: Normal range of motion.  Skin:    General: Skin is warm and dry.     Coloration: Skin is not jaundiced or pale.  Neurological:     General: No focal deficit present.     Mental Status: She is alert.     ED Results / Procedures / Treatments   Labs (all labs ordered are  listed, but only abnormal results are displayed) Labs Reviewed  URINE CULTURE  URINALYSIS, ROUTINE W REFLEX MICROSCOPIC  BASIC METABOLIC PANEL  CBC  PROTIME-INR  I-STAT BETA HCG BLOOD, ED ([facility], [facility], [facility] ONLY)    EKG None  Radiology No results found.  Procedures Procedures   Medications Ordered in ED Medications  prochlorperazine (COMPAZINE) injection 10 mg (has no administration in time range)  oxyCODONE-acetaminophen (PERCOCET/ROXICET) 5-325 MG per tablet 1 tablet (has no administration in time range)    ED Course  I have reviewed the triage vital signs and the nursing notes.  Pertinent labs & imaging results that were available during my care of the patient were reviewed by me and considered in my medical decision making (see chart for details).    MDM Rules/Calculators/A&P                          Possible UTI.  Patient has had multiple CT scans done within the last 6 months without a cause of her hematuria and other symptoms.  I discussed with her that we will give her 1 dose of pain medicine and nausea medicine will wait for lab results.  Patient did not indicate to me that there was any problem but apparently told the nurse that she was unhappy with that and she wanted a CT scan.  She eloped from emergency department after this discussion with nursing prior to me reevaluating.  Her vital signs are within normal limits have low suspicion for life-threatening illness and thus will not attempt to call back or asked her to return the emergency department.  Final Clinical Impression(s) / ED Diagnoses Final diagnoses:  Urinary frequency  Dysuria  Eloped from emergency department    Rx / DC Orders ED Discharge Orders    None       Maryon Kemnitz, Barbara Cower, MD 05/27/20 [phone]

## 2020-09-05 ENCOUNTER — Other Ambulatory Visit: Payer: Self-pay

## 2020-09-05 ENCOUNTER — Encounter (HOSPITAL_COMMUNITY): Payer: Self-pay

## 2020-09-05 ENCOUNTER — Emergency Department (HOSPITAL_COMMUNITY): Payer: Self-pay

## 2020-09-05 ENCOUNTER — Inpatient Hospital Stay (HOSPITAL_COMMUNITY)
Admission: EM | Admit: 2020-09-05 | Discharge: 2020-09-07 | DRG: 921 | Disposition: A | Discharge status: Home / Self Care | Payer: Self-pay | Attending: Family Medicine | Admitting: Family Medicine

## 2020-09-05 ENCOUNTER — Emergency Department (HOSPITAL_BASED_OUTPATIENT_CLINIC_OR_DEPARTMENT_OTHER): Payer: Self-pay

## 2020-09-05 DIAGNOSIS — L03311 Cellulitis of abdominal wall: Secondary | ICD-10-CM

## 2020-09-05 DIAGNOSIS — T8140XA Infection following a procedure, unspecified, initial encounter: Secondary | ICD-10-CM | POA: Diagnosis present

## 2020-09-05 DIAGNOSIS — Z91041 Radiographic dye allergy status: Secondary | ICD-10-CM

## 2020-09-05 DIAGNOSIS — Z886 Allergy status to analgesic agent status: Secondary | ICD-10-CM

## 2020-09-05 DIAGNOSIS — M7989 Other specified soft tissue disorders: Secondary | ICD-10-CM

## 2020-09-05 DIAGNOSIS — Z86718 Personal history of other venous thrombosis and embolism: Secondary | ICD-10-CM

## 2020-09-05 DIAGNOSIS — Y929 Unspecified place or not applicable: Secondary | ICD-10-CM

## 2020-09-05 DIAGNOSIS — R1013 Epigastric pain: Secondary | ICD-10-CM | POA: Diagnosis present

## 2020-09-05 DIAGNOSIS — Z91040 Latex allergy status: Secondary | ICD-10-CM

## 2020-09-05 DIAGNOSIS — Z889 Allergy status to unspecified drugs, medicaments and biological substances status: Secondary | ICD-10-CM

## 2020-09-05 DIAGNOSIS — Z7901 Long term (current) use of anticoagulants: Secondary | ICD-10-CM

## 2020-09-05 DIAGNOSIS — T8131XA Disruption of external operation (surgical) wound, not elsewhere classified, initial encounter: Principal | ICD-10-CM | POA: Diagnosis present

## 2020-09-05 DIAGNOSIS — Z20822 Contact with and (suspected) exposure to covid-19: Secondary | ICD-10-CM | POA: Diagnosis present

## 2020-09-05 DIAGNOSIS — Z79899 Other long term (current) drug therapy: Secondary | ICD-10-CM

## 2020-09-05 DIAGNOSIS — Y838 Other surgical procedures as the cause of abnormal reaction of the patient, or of later complication, without mention of misadventure at the time of the procedure: Secondary | ICD-10-CM | POA: Diagnosis present

## 2020-09-05 DIAGNOSIS — Z888 Allergy status to other drugs, medicaments and biological substances status: Secondary | ICD-10-CM

## 2020-09-05 LAB — CBC WITH DIFFERENTIAL/PLATELET
Abs Immature Granulocytes: 0.04 10*3/uL (ref 0.00–0.07)
Basophils Absolute: 0 10*3/uL (ref 0.0–0.1)
Basophils Relative: 0 %
Eosinophils Absolute: 0.1 10*3/uL (ref 0.0–0.5)
Eosinophils Relative: 1 %
HCT: 33.2 % — ABNORMAL LOW (ref 36.0–46.0)
Hemoglobin: 10.5 g/dL — ABNORMAL LOW (ref 12.0–15.0)
Immature Granulocytes: 1 %
Lymphocytes Relative: 33 %
Lymphs Abs: 2.3 10*3/uL (ref 0.7–4.0)
MCH: 28.7 pg (ref 26.0–34.0)
MCHC: 31.6 g/dL (ref 30.0–36.0)
MCV: 90.7 fL (ref 80.0–100.0)
Monocytes Absolute: 0.5 10*3/uL (ref 0.1–1.0)
Monocytes Relative: 7 %
Neutro Abs: 4 10*3/uL (ref 1.7–7.7)
Neutrophils Relative %: 58 %
Platelets: 248 10*3/uL (ref 150–400)
RBC: 3.66 MIL/uL — ABNORMAL LOW (ref 3.87–5.11)
RDW: 14.9 % (ref 11.5–15.5)
WBC: 7 10*3/uL (ref 4.0–10.5)
nRBC: 0 % (ref 0.0–0.2)

## 2020-09-05 LAB — COMPREHENSIVE METABOLIC PANEL
ALT: 68 U/L — ABNORMAL HIGH (ref 0–44)
AST: 21 U/L (ref 15–41)
Albumin: 3.4 g/dL — ABNORMAL LOW (ref 3.5–5.0)
Alkaline Phosphatase: 50 U/L (ref 38–126)
Anion gap: 10 (ref 5–15)
BUN: 16 mg/dL (ref 6–20)
CO2: 26 mmol/L (ref 22–32)
Calcium: 9 mg/dL (ref 8.9–10.3)
Chloride: 102 mmol/L (ref 98–111)
Creatinine, Ser: 0.87 mg/dL (ref 0.44–1.00)
GFR, Estimated: >60 mL/min (ref >60)
Glucose, Bld: 83 mg/dL (ref 70–99)
Potassium: 3.6 mmol/L (ref 3.5–5.1)
Sodium: 138 mmol/L (ref 135–145)
Total Bilirubin: 0.6 mg/dL (ref 0.3–1.2)
Total Protein: 6.6 g/dL (ref 6.5–8.1)

## 2020-09-05 LAB — RESP PANEL BY RT-PCR (FLU A&B, COVID) ARPGX2
Influenza A by PCR: NEGATIVE
Influenza B by PCR: NEGATIVE
SARS Coronavirus 2 by RT PCR: NEGATIVE

## 2020-09-05 MED ORDER — SODIUM CHLORIDE 0.9 % IV BOLUS
1000.0000 mL | Freq: Once | INTRAVENOUS | Status: AC
  Administered 2020-09-05: 1000 mL via INTRAVENOUS

## 2020-09-05 MED ORDER — ONDANSETRON HCL 4 MG/2ML IJ SOLN: 4.0000 mg | Freq: Four times a day (QID) | INTRAMUSCULAR | Status: DC | PRN

## 2020-09-05 MED ORDER — PIPERACILLIN-TAZOBACTAM 3.375 G IVPB 30 MIN
3.3750 g | Freq: Once | INTRAVENOUS | Status: AC
  Administered 2020-09-05: 3.375 g via INTRAVENOUS
  Filled 2020-09-05: qty 50

## 2020-09-05 MED ORDER — ENOXAPARIN SODIUM 40 MG/0.4ML IJ SOSY: 40.0000 mg | PREFILLED_SYRINGE | INTRAMUSCULAR | Status: DC

## 2020-09-05 MED ORDER — HEPARIN (PORCINE) 25000 UT/250ML-% IV SOLN
1350.0000 [IU]/h | INTRAVENOUS | Status: DC
  Administered 2020-09-05: 1200 [IU]/h via INTRAVENOUS
  Administered 2020-09-06: 1350 [IU]/h via INTRAVENOUS
  Filled 2020-09-05 (×4): qty 250

## 2020-09-05 MED ORDER — METOCLOPRAMIDE HCL 5 MG/ML IJ SOLN
5.0000 mg | Freq: Three times a day (TID) | INTRAMUSCULAR | Status: DC | PRN
  Administered 2020-09-06: 10 mg via INTRAVENOUS
  Filled 2020-09-05: qty 2

## 2020-09-05 MED ORDER — MORPHINE SULFATE (PF) 4 MG/ML IV SOLN
4.0000 mg | Freq: Once | INTRAVENOUS | Status: AC
  Administered 2020-09-05: 4 mg via INTRAVENOUS
  Filled 2020-09-05: qty 1

## 2020-09-05 MED ORDER — ESCITALOPRAM OXALATE 20 MG PO TABS
20.0000 mg | ORAL_TABLET | Freq: Every day | ORAL | Status: DC
  Filled 2020-09-05: qty 1

## 2020-09-05 MED ORDER — SODIUM CHLORIDE 0.9 % IV SOLN
1.0000 g | INTRAVENOUS | Status: DC
  Administered 2020-09-05 – 2020-09-06 (×2): 1 g via INTRAVENOUS
  Filled 2020-09-05 (×3): qty 10

## 2020-09-05 MED ORDER — ACETAMINOPHEN 325 MG PO TABS: 650.0000 mg | ORAL_TABLET | Freq: Four times a day (QID) | ORAL | Status: DC | PRN

## 2020-09-05 MED ORDER — METOCLOPRAMIDE HCL 5 MG/ML IJ SOLN
10.0000 mg | Freq: Once | INTRAMUSCULAR | Status: AC
  Administered 2020-09-05: 10 mg via INTRAVENOUS
  Filled 2020-09-05: qty 2

## 2020-09-05 MED ORDER — DIPHENHYDRAMINE HCL 50 MG/ML IJ SOLN
25.0000 mg | Freq: Once | INTRAMUSCULAR | Status: AC
  Administered 2020-09-05: 25 mg via INTRAVENOUS
  Filled 2020-09-05: qty 1

## 2020-09-05 MED ORDER — LACTATED RINGERS IV SOLN: INTRAVENOUS | Status: DC

## 2020-09-05 MED ORDER — ACETAMINOPHEN 650 MG RE SUPP: 650.0000 mg | Freq: Four times a day (QID) | RECTAL | Status: DC | PRN

## 2020-09-05 MED ORDER — MORPHINE SULFATE (PF) 2 MG/ML IV SOLN
2.0000 mg | INTRAVENOUS | Status: DC | PRN
  Administered 2020-09-05: 4 mg via INTRAVENOUS
  Administered 2020-09-06: 2 mg via INTRAVENOUS
  Administered 2020-09-06: 4 mg via INTRAVENOUS
  Filled 2020-09-05 (×2): qty 2
  Filled 2020-09-05: qty 1

## 2020-09-05 NOTE — ED Notes (Signed)
Pt had a temp at home of 101.8 prior to taking 1gm of tylenol

## 2020-09-05 NOTE — H&P (Signed)
History and Physical    Amanda Clymer XLK:[id number] DOB: 1980/12/08 DOA: 09/05/2020  PCP: Pcp, No  Patient coming from: Home  I have personally briefly reviewed patient's old medical records in [facility]  Chief Complaint: Post op infection  HPI: Amanda Bradley is a 40 y.o. 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  Pt had abdominoplasty performed in [place] x2 weeks ago.  Came to [place] to be with sister while she recovered.  Pt presents to ED for c/o fever, abd pain, purulent drainage from surgical site.  Symptoms onset yesterday AM.  Symptoms worsening, now severe.  Persistent N/V for past 2 days.  Tm 101.8 at home.  Also has swelling, redness, pain and itching to R arm.  On Xarelto but had to stop this from May 30-June 3 due to surgery.  Also not able to keep it down for past 2 days.  No CP, SOB.   ED Course: Pt given dose of zosyn.  Multiple rounds of morphine, hospitalist asked to admit.   Review of Systems: As per HPI, otherwise all review of systems negative.  Past Medical History:  Diagnosis Date   Arm vein blood clot, left    Dvt femoral (deep venous thrombosis) (HCC)     Past Surgical History:  Procedure Laterality Date   BACK SURGERY       reports that she has never smoked. She has never used smokeless tobacco. She reports that she does not drink alcohol and does not use drugs.  Allergies  Allergen Reactions   Contrast Media [Iodinated Diagnostic Agents] Hives   Fentanyl Hives   Latex    Nsaids    Other Other (See Comments)    nuts   Phenergan [Promethazine] Hives and Itching    Patient received phenergan and dilaudid at same time, also had reaction earlier to zofran and dilaudid   Zofran [Ondansetron] Hives and Itching    Family History  Problem Relation Age of Onset   Immunodeficiency Neg Hx      Prior to Admission medications   Medication Sig Start Date End Date Taking? Authorizing Provider   escitalopram (LEXAPRO) 20 MG tablet Take 20 mg by mouth daily.   Yes [provider]  rivaroxaban (XARELTO) 20 MG TABS tablet Take 20 mg by mouth daily with supper.   Yes [provider]  diphenhydrAMINE (BENADRYL) 25 MG tablet Take 1 tablet (25 mg total) by mouth every 6 (six) hours. Patient not taking: Reported on 09/05/2020 02/03/20   Vanetta Mulders, MD  famotidine (PEPCID) 20 MG tablet Take 1 tablet (20 mg total) by mouth 2 (two) times daily. Patient not taking: Reported on 09/05/2020 02/03/20   Vanetta Mulders, MD  promethazine (PHENERGAN) 25 MG tablet Take 1 tablet (25 mg total) by mouth every 6 (six) hours as needed for nausea or vomiting. Patient not taking: Reported on 09/05/2020 01/25/20   Milagros Loll, MD    Physical Exam: Vitals:   09/05/20 1812 09/05/20 1830 09/05/20 2012 09/05/20 2030  BP: 139/89 137/81 132/74 127/69  Pulse: 85 84 89 71  Resp: 20 19 18 15   Temp:      SpO2: 98% 98% 99% 98%  Weight:      Height:        Constitutional: appears very uncomfortable Eyes: PERRL, lids and conjunctivae normal ENMT: Mucous membranes are moist. Posterior pharynx clear of any exudate or lesions.Normal dentition.  Neck: normal, supple, no masses, no thyromegaly Respiratory: clear to auscultation bilaterally,  no wheezing, no crackles. Normal respiratory effort. No accessory muscle use.  Cardiovascular: Regular rate and rhythm, no murmurs / rubs / gallops. No extremity edema. 2+ pedal pulses. No carotid bruits.  Abdomen: Generalized TTP over lower abdomen, cellulitis present over lower abdomen.  1cm area of dehiscence in L lower quadrant, no drainage.  Remainder of incision otherwise well healed.  Some packing in place in umbilicus with purulent drainage present. Musculoskeletal: no clubbing / cyanosis. No joint deformity upper and lower extremities. Good ROM, no contractures. Normal muscle tone.  Skin: no rashes, lesions, ulcers. No induration Neurologic: CN  2-12 grossly intact. Sensation intact, DTR normal. Strength 5/5 in all 4.  Psychiatric: Normal judgment and insight. Alert and oriented x 3. Normal mood.    Labs on Admission: I have personally reviewed following labs and imaging studies  CBC: Recent Labs  Lab 09/05/20 1753  WBC 7.0  NEUTROABS 4.0  HGB 10.5*  HCT 33.2*  MCV 90.7  PLT 248   Basic Metabolic Panel: Recent Labs  Lab 09/05/20 1753  NA 138  K 3.6  CL 102  CO2 26  GLUCOSE 83  BUN 16  CREATININE 0.87  CALCIUM 9.0   GFR: Estimated Creatinine Clearance: 95.4 mL/min (by C-G formula based on SCr of 0.87 mg/dL). Liver Function Tests: Recent Labs  Lab 09/05/20 1753  AST 21  ALT 68*  ALKPHOS 50  BILITOT 0.6  PROT 6.6  ALBUMIN 3.4*   No results for input(s): LIPASE, AMYLASE in the last 168 hours. No results for input(s): AMMONIA in the last 168 hours. Coagulation Profile: No results for input(s): INR, PROTIME in the last 168 hours. Cardiac Enzymes: No results for input(s): CKTOTAL, CKMB, CKMBINDEX, TROPONINI in the last 168 hours. BNP (last 3 results) No results for input(s): PROBNP in the last 8760 hours. HbA1C: No results for input(s): HGBA1C in the last 72 hours. CBG: No results for input(s): GLUCAP in the last 168 hours. Lipid Profile: No results for input(s): CHOL, HDL, LDLCALC, TRIG, CHOLHDL, LDLDIRECT in the last 72 hours. Thyroid Function Tests: No results for input(s): TSH, T4TOTAL, FREET4, T3FREE, THYROIDAB in the last 72 hours. Anemia Panel: No results for input(s): VITAMINB12, FOLATE, FERRITIN, TIBC, IRON, RETICCTPCT in the last 72 hours. Urine analysis:    Component Value Date/Time   COLORURINE YELLOW 02/02/2020 1335   APPEARANCEUR CLOUDY (A) 02/02/2020 1335   LABSPEC 1.021 02/02/2020 1335   PHURINE 7.0 02/02/2020 1335   GLUCOSEU NEGATIVE 02/02/2020 1335   HGBUR LARGE (A) 02/02/2020 1335   BILIRUBINUR NEGATIVE 02/02/2020 1335   KETONESUR NEGATIVE 02/02/2020 1335   PROTEINUR 30 (A)  02/02/2020 1335   NITRITE NEGATIVE 02/02/2020 1335   LEUKOCYTESUR NEGATIVE 02/02/2020 1335    Radiological Exams on Admission: CT ABDOMEN PELVIS WO CONTRAST  Result Date: 09/05/2020 CLINICAL DATA:  Abdominal pain and fever postoperatively. Recent liposuction. Drainage from the abdomen. Nausea and vomiting. EXAM: CT ABDOMEN AND PELVIS WITHOUT CONTRAST TECHNIQUE: Multidetector CT imaging of the abdomen and pelvis was performed following the standard protocol without IV contrast. COMPARISON:  02/02/2020 FINDINGS: Lower chest: The lung bases are clear. Small esophageal hiatal hernia. Hepatobiliary: No focal liver abnormality is seen. No gallstones, gallbladder wall thickening, or biliary dilatation. Pancreas: Unremarkable. No pancreatic ductal dilatation or surrounding inflammatory changes. Spleen: Normal in size without focal abnormality. Adrenals/Urinary Tract: No adrenal gland nodules. Parenchymal stones in the left kidney. No hydronephrosis or hydroureter. No ureteral stones identified. Bladder is unremarkable. Stomach/Bowel: Stomach, small bowel, and colon are not  abnormally distended. Stool fills the colon. No inflammatory changes are appreciated. Appendix is normal. Vascular/Lymphatic: No significant vascular findings are present. No enlarged abdominal or pelvic lymph nodes. Reproductive: Uterus and bilateral adnexa are unremarkable. Other: No free air or free fluid in the abdomen. Infiltration in the anterior abdominal subcutaneous fat with surface irregularity. This is likely postoperative edema but could reflect cellulitis in the appropriate clinical setting. No loculated collection is identified. Minimal subcutaneous gas. Musculoskeletal: No acute or significant osseous findings. IMPRESSION: 1. Infiltration in the subcutaneous fat of the anterior abdominal wall, most likely postoperative. No loculated collections identified. 2. No evidence of bowel obstruction or inflammation. 3. Nonobstructing left  renal stones. 4. Small esophageal hiatal hernia. Electronically Signed   By: Burman Nieves M.D.   On: 09/05/2020 19:24   DG Chest 2 View  Result Date: 09/05/2020 CLINICAL DATA:  Recent surgery.  Suspected sepsis. EXAM: CHEST - 2 VIEW COMPARISON:  None. FINDINGS: The cardiac silhouette, mediastinal and hilar contours are within normal limits. The lungs are clear of an acute process. Minimal streaky left basilar atelectasis. No pleural effusions. The bony thorax is intact. IMPRESSION: Minimal left basilar atelectasis. No infiltrates, edema or effusions. Electronically Signed   By: Rudie Meyer M.D.   On: 09/05/2020 13:29   UE VENOUS DUPLEX [facility] 7 am - 7 pm)  Result Date: 09/05/2020 UPPER VENOUS STUDY  Patient Name:  Amanda Gornick  Date of Exam:   09/05/2020 Medical Rec #: [id number]     Accession #:    [id number] Date of Birth: Apr 21, 1980      Patient Gender: F Patient Age:   42Y Exam Location:  [facility] Procedure:      VAS [place] UPPER EXTREMITY VENOUS DUPLEX Referring Phys: [id number] KELSEY N FORD --------------------------------------------------------------------------------  Indications: Swelling Risk Factors: Surgery. Comparison Study: No prior studies. Performing Technologist: Chanda Busing RVT  Examination Guidelines: A complete evaluation includes B-mode imaging, spectral Doppler, color Doppler, and power Doppler as needed of all accessible portions of each vessel. Bilateral testing is considered an integral part of a complete examination. Limited examinations for reoccurring indications may be performed as noted.  Right Findings: +----------+------------+---------+-----------+----------+-------+ RIGHT     CompressiblePhasicitySpontaneousPropertiesSummary +----------+------------+---------+-----------+----------+-------+ IJV           Full       Yes       Yes                      +----------+------------+---------+-----------+----------+-------+ Subclavian    Full       Yes        Yes                      +----------+------------+---------+-----------+----------+-------+ Axillary      Full       Yes       Yes                      +----------+------------+---------+-----------+----------+-------+ Brachial      Full       Yes       Yes                      +----------+------------+---------+-----------+----------+-------+ Radial        Full                                          +----------+------------+---------+-----------+----------+-------+  Ulnar         Full                                          +----------+------------+---------+-----------+----------+-------+ Cephalic      None                                  Chronic +----------+------------+---------+-----------+----------+-------+ Basilic       Full                                          +----------+------------+---------+-----------+----------+-------+  Left Findings: +----------+------------+---------+-----------+----------+-------+ LEFT      CompressiblePhasicitySpontaneousPropertiesSummary +----------+------------+---------+-----------+----------+-------+ Subclavian    Full       Yes       Yes                      +----------+------------+---------+-----------+----------+-------+  Summary:  Right: No evidence of deep vein thrombosis in the upper extremity. 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  Left: No evidence of thrombosis in the subclavian.  *See table(s) above for measurements and observations.    Preliminary     EKG: Independently reviewed.  Assessment/Plan Principal Problem:   Post op infection Active Problems:   History of DVT (deep vein thrombosis)    Post op cellulitis of abdominal wall - Empiric rocephin Check MRSA PCR nares Check wound culture No SIRS at this time. BCx pending No drain able abscess on CT Morphine PRN pain May wish to consult plastic surgery in AM: but  dont think this is surgical at the moment. H/o DVT - No mention of DVT today on [place] of LUE when they put in IV access Will put pt on heparin gtt until she is able to tolerate PO xarelto again  DVT prophylaxis: heparin gtt Code Status: Full Family Communication: No family in room Disposition Plan: Home after treatment for cellulitis Consults called: None Admission status: Place in 62    Phillips Goulette M. DO [facility]  How to contact the [facility] Attending or Consulting provider 7A - 7P or covering provider during after hours 7P -7A, for this patient?  Check the care team in [facility] and look for a) attending/consulting [facility] provider listed and b) the [facility] team listed Log into [email or web address]  Amion Physician Scheduling and messaging for groups and whole hospitals  On call and physician scheduling software for group practices, residents, hospitalists and other medical providers for call, clinic, rotation and shift schedules. On[employer] is a hospital-wide system for scheduling doctors and paging doctors on call. EasyPlot is for scientific plotting and data analysis.  [email or web address]  and use [facility]'s universal password to access. If you do not have the password, please contact the hospital operator.  Locate the [facility] provider you are looking for under [facility] and page to a number that you can be directly reached. If you still have difficulty reaching the provider, please page the [facility] (Director on Call) for the Hospitalists listed on amion for assistance.  09/05/2020, 9:20 PM

## 2020-09-05 NOTE — Progress Notes (Signed)
Right upper extremity venous duplex has been completed. Preliminary results can be found in CV Proc through chart review. Results were given to Jodi Geralds PA.  09/05/20 4:47 PM Olen Cordial RVT

## 2020-09-05 NOTE — Progress Notes (Signed)
ANTICOAGULATION CONSULT NOTE - Initial Consult  Pharmacy Consult for heparin Indication: Hx DVTs  Allergies  Allergen Reactions   Contrast Media [Iodinated Diagnostic Agents] Hives   Fentanyl Hives   Latex    Nsaids    Other Other (See Comments)    nuts   Phenergan [Promethazine] Hives and Itching    Patient received phenergan and dilaudid at same time, also had reaction earlier to zofran and dilaudid   Zofran [Ondansetron] Hives and Itching    Patient Measurements: Height: 5\' 7"  (170.2 cm) Weight: 81.6 kg (180 lb) IBW/kg (Calculated) : 61.6  Vital Signs: Temp: 98.3 F (36.8 C) (06/14 1209) BP: 127/69 (06/14 2030) Pulse Rate: 71 (06/14 2030)  Labs: Recent Labs    09/05/20 1753  HGB 10.5*  HCT 33.2*  PLT 248  CREATININE 0.87    Estimated Creatinine Clearance: 95.4 mL/min (by C-G formula based on SCr of 0.87 mg/dL).   Medical History: Past Medical History:  Diagnosis Date   Arm vein blood clot, left    Dvt femoral (deep venous thrombosis) (HCC)     Assessment: 52 YOF presenting with fever, abdominal pain and unable to take PO, hx of DVTs on Xarelto PTA with last dose 6/12  Goal of Therapy:  Heparin level 0.3-0.7 units/ml aPTT 66-102 seconds Monitor platelets by anticoagulation protocol: Yes   Plan:  Heparin gtt at 1200 units/hr F/u 6 hour aPTT/HL F/u ability to take PO and transition back to Xarelto  8/12, PharmD Clinical Pharmacist ED Pharmacist Phone # [phone] 09/05/2020 9:50 PM

## 2020-09-05 NOTE — ED Provider Notes (Signed)
Emergency Medicine Provider Triage Evaluation Note  Amanda Bradley , a 40 y.o. female  was evaluated in triage.  Pt complains of abdominal pain and right arm swelling   Review of Systems  Positive: Fever, oozing abdominal wound Negative: Shortness of breath  Physical Exam  BP (!) 181/160 (BP Location: Left Leg)   Pulse 91   Temp 98.3 F (36.8 C)   Resp 16   SpO2 100%  Gen:   Awake, no distress   Resp:  Normal effort  MSK:   Swollen right arm   Other:  Tender abdominal wound   Medical Decision Making  Medically screening exam initiated at 12:57 PM.  Appropriate orders placed.  Amanda Scales was informed that the remainder of the evaluation will be completed by another provider, this initial triage assessment does not replace that evaluation, and the importance of remaining in the ED until their evaluation is complete.     Osie Cheeks 09/05/20 1259    Gerhard Munch, MD 09/06/20 [phone]

## 2020-09-05 NOTE — ED Provider Notes (Signed)
[facility] EMERGENCY DEPARTMENT Provider Note   CSN: [id number] Arrival date & time: 09/05/20  1204     History Chief Complaint  Patient presents with   Post-op Problem    Amanda Bradley is a 40 y.o. female.  Amanda Wasko is a 40 y.o. 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  Patient reports that yesterday morning she woke up and noticed there was a small hole in her lower abdominal incision with pus draining from it.  She also reports some pus drainage coming from her navel.  She reports associated general abdominal pain, worse across the lower abdomen.  Reports since yesterday she has been having persistent and worsening pain, and has been persistently vomiting for the past 2 days unable to keep down food, fluids or her medications.  She reports fevers at home up to 101.8, did take Tylenol just prior to coming to the emergency department.  She denies any chest pain or shortness of breath.  No cough, no known sick contacts.  No diarrhea or changes in bowel movements.  She also reports swelling, redness, pain and itching to the right arm, prior history of DVT but was told after treatment that this had resolved.  She is on Xarelto, had to stop this from the 30th to June 3 due to her surgery, and then resume this medication, has not been able to keep it down for the past 2 days.  No leg swelling.  Was on antibiotics for the first week after surgery in [place], but completed these antibiotics 1 week ago.  Came to [place] to stay with her sister while recovering from surgery.  The history is provided by the patient.      Past Medical History:  Diagnosis Date   Arm vein blood clot, left    Dvt femoral (deep venous thrombosis) (HCC)     There are no problems to display for this  patient.   Past Surgical History:  Procedure Laterality Date   BACK SURGERY       OB History   No obstetric history on file.     History reviewed. No pertinent family history.  Social History   Tobacco Use   Smoking status: Never   Smokeless tobacco: Never  Vaping Use   Vaping Use: Never used  Substance Use Topics   Alcohol use: Never   Drug use: Never    Home Medications Prior to Admission medications   Medication Sig Start Date End Date Taking? Authorizing Provider  diphenhydrAMINE (BENADRYL) 25 MG tablet Take 1 tablet (25 mg total) by mouth every 6 (six) hours. 02/03/20   Vanetta Mulders, MD  famotidine (PEPCID) 20 MG tablet Take 1 tablet (20 mg total) by mouth 2 (two) times daily. 02/03/20   Vanetta Mulders, MD  promethazine (PHENERGAN) 25 MG tablet Take 1 tablet (25 mg total) by mouth every 6 (six) hours as needed for nausea or vomiting. 01/25/20   Milagros Loll, MD    Allergies    Fentanyl, Latex, Nsaids, Other, Phenergan [promethazine], and Zofran [ondansetron]  Review of Systems   Review of Systems  Constitutional:  Positive for chills and fever.  HENT: Negative.    Respiratory:  Negative for cough and shortness of breath.   Cardiovascular:  Negative for chest pain.  Gastrointestinal:  Positive for abdominal  pain, nausea and vomiting. Negative for diarrhea.  Genitourinary:  Negative for dysuria, frequency, vaginal bleeding and vaginal discharge.  Skin:  Positive for wound.  Neurological:  Negative for dizziness, syncope and light-headedness.  All other systems reviewed and are negative.  Physical Exam Updated Vital Signs BP (!) 159/86   Pulse 62   Temp 98.3 F (36.8 C)   Resp 14   SpO2 98%   Physical Exam Vitals and nursing note reviewed.  Constitutional:      General: She is not in acute distress.    Appearance: Normal appearance. She is well-developed. She is ill-appearing. She is not diaphoretic.     Comments: Alert, ill-appearing,  appears very uncomfortable  HENT:     Head: Normocephalic and atraumatic.     Mouth/Throat:     Mouth: Mucous membranes are moist.     Pharynx: Oropharynx is clear.  Eyes:     General:        Right eye: No discharge.        Left eye: No discharge.  Cardiovascular:     Rate and Rhythm: Normal rate and regular rhythm.     Pulses: Normal pulses.     Heart sounds: Normal heart sounds. No murmur heard.   No friction rub. No gallop.  Pulmonary:     Effort: Pulmonary effort is normal. No respiratory distress.     Breath sounds: Normal breath sounds. No wheezing or rales.     Comments: Respirations equal and unlabored, patient able to speak in full sentences, lungs clear to auscultation bilaterally  Abdominal:     General: Bowel sounds are normal. There is no distension.     Palpations: Abdomen is soft. There is no mass.     Tenderness: There is abdominal tenderness. There is guarding.     Comments: Abdomen is soft and nondistended, bowel sounds present throughout, patient has generalized tenderness throughout the abdomen with some guarding, tenderness worse across the lower abdomen, there is a low transverse abdominal incision, overall well-healed but there is  an ~1 in area of dehiscence in the left lower quadrant currently without drainage, and there is erythema surrounding the incision, it is warm to the touch there is also some packing in place in the umbilicus with some purulent drainage present.  Musculoskeletal:        General: No deformity.     Cervical back: Neck supple.  Skin:    General: Skin is warm and dry.     Capillary Refill: Capillary refill takes less than 2 seconds.  Neurological:     Mental Status: She is alert and oriented to person, place, and time.     Coordination: Coordination normal.     Comments: Speech is clear, able to follow commands Moves extremities without ataxia, coordination intact  Psychiatric:        Mood and Affect: Mood normal.        Behavior:  Behavior normal.    ED Results / Procedures / Treatments   Labs (all labs ordered are listed, but only abnormal results are displayed) Labs Reviewed  RESP PANEL BY RT-PCR (FLU A&B, COVID) ARPGX2  CULTURE, BLOOD (ROUTINE X 2)  CULTURE, BLOOD (ROUTINE X 2)  COMPREHENSIVE METABOLIC PANEL  LACTIC ACID, PLASMA  LACTIC ACID, PLASMA  CBC WITH DIFFERENTIAL/PLATELET  PROTIME-INR  URINALYSIS, ROUTINE W REFLEX MICROSCOPIC  I-STAT BETA HCG BLOOD, ED ([facility], [facility], [facility] ONLY)    EKG None  Radiology DG Chest 2 View  Result Date: 09/05/2020  CLINICAL DATA:  Recent surgery.  Suspected sepsis. EXAM: CHEST - 2 VIEW COMPARISON:  None. FINDINGS: The cardiac silhouette, mediastinal and hilar contours are within normal limits. The lungs are clear of an acute process. Minimal streaky left basilar atelectasis. No pleural effusions. The bony thorax is intact. IMPRESSION: Minimal left basilar atelectasis. No infiltrates, edema or effusions. Electronically Signed   By: Rudie MeyerP.  Gallerani M.D.   On: 09/05/2020 13:29   UE VENOUS DUPLEX [facility] & [facility] 7 am - 7 pm)  Result Date: 09/05/2020 UPPER VENOUS STUDY  Patient Name:  [place] Sturtevant  Date of Exam:   09/05/2020 Medical Rec #: [id number]     Accession #:    [id number] Date of Birth: 03/31/1980      Patient Gender: F Patient Age:   [age]Y [facility] Procedure:      VAS US UPPER EXTREMITY VENOUS DUPLEX Referring Phys: [id number] Rayaan Lorah N Devlyn Parish --------------------------------------------------------------------------------  Indications: Swelling Risk Factors: Surgery. Comparison Study: No prior studies. Performing Technologist: Chanda BusingGregory Collins RVT  Examination Guidelines: A complete evaluation includes B-mode imaging, spectral Doppler, color Doppler, and power Doppler as needed of all accessible portions of each vessel. Bilateral testing is considered an integral part of a complete examination. Limited examinations for reoccurring indications may be performed as noted.   Right Findings: +----------+------------+---------+-----------+----------+-------+ RIGHT     CompressiblePhasicitySpontaneousPropertiesSummary +----------+------------+---------+-----------+----------+-------+ IJV           Full       Yes       Yes                      +----------+------------+---------+-----------+----------+-------+ Subclavian    Full       Yes       Yes                      +----------+------------+---------+-----------+----------+-------+ Axillary      Full       Yes       Yes                      +----------+------------+---------+-----------+----------+-------+ Brachial      Full       Yes       Yes                      +----------+------------+---------+-----------+----------+-------+ Radial        Full                                          +----------+------------+---------+-----------+----------+-------+ Ulnar         Full                                          +----------+------------+---------+-----------+----------+-------+ Cephalic      None                                  Chronic +----------+------------+---------+-----------+----------+-------+ Basilic       Full                                          +----------+------------+---------+-----------+----------+-------+  Left Findings: +----------+------------+---------+-----------+----------+-------+ LEFT      CompressiblePhasicitySpontaneousPropertiesSummary +----------+------------+---------+-----------+----------+-------+ Subclavian    Full       Yes       Yes                      +----------+------------+---------+-----------+----------+-------+  Summary:  Right: No evidence of deep vein thrombosis in the upper extremity. 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  Left: No evidence of thrombosis in the subclavian.  *See table(s) above for measurements and observations.    Preliminary      Procedures Procedures   Medications Ordered in ED Medications  sodium chloride 0.9 % bolus 1,000 mL (has no administration in time range)  morphine 4 MG/ML injection 4 mg (has no administration in time range)  metoCLOPramide (REGLAN) injection 10 mg (has no administration in time range)  diphenhydrAMINE (BENADRYL) injection 25 mg (has no administration in time range)  piperacillin-tazobactam (ZOSYN) IVPB 3.375 g (has no administration in time range)    ED Course  I have reviewed the triage vital signs and the nursing notes.  Pertinent labs & imaging results that were available during my care of the patient were reviewed by me and considered in my medical decision making (see chart for details).    MDM Rules/Calculators/A&P                         40 y.o. female presents to the ED with complaints of fever, vomiting, abdominal pain and small area of dehiscence over surgical wound from recent tummy tuck and liposuction, this involves an extensive number of treatment options, and is a complaint that carries with it a high risk of complications and morbidity.  The differential diagnosis includes postop infection, intra-abdominal abscess, cellulitis, could also be related to potential pneumonia, UTI, COVID, given arm swelling also consider potential DVT (Ddx)  On arrival pt is nontoxic, vitals normal on arrival, but patient does report fever of 101.8 at home, took 1 g of Tylenol just prior to coming in. Exam significant for generalized tenderness throughout the abdomen, redness over incision with small area of dehiscence. Right arm tender and swollen, history of prior DVT, is on Xarelto, stopped this briefly for her surgery, has not been able to keep it down for the past 2 days due to vomiting.  I ordered morphine, Zofran and IV fluids for symptom management, Zosyn for antibiotic coverage for likely intra-abdominal infection.  Lab work delayed due to difficulty with IV access, but IV team  able to place midline catheter in the right upper extremity.  Lab work pending at time of shift change  Imaging Studies ordered:  I ordered imaging studies which included chest x-ray, venous ultrasound of right upper extremity, CT abdomen pelvis, I independently visualized and interpreted imaging which showed  CXR: No active cardiopulmonary disease. Venous ultrasound: Superficial thrombosis involving the right celiac vein, no DVT  CT pending at shift change, initially ordered CT abdomen pelvis with contrast, patient later reported contrast allergy, so this was changed to a noncontrast study.  ED Course:   Imaging and lab work is pending, anticipate patient will need admission for postoperative infection with fevers and vomiting after recent tummy tuck, liposuction and abdominal muscle reconstruction.  Care signed out to Dr. Anitra Lauth who will follow up on imaging and lab work and disposition appropriately.   Portions of this note were generated with Scientist, clinical (histocompatibility and immunogenetics). Dictation errors may  occur despite best attempts at proofreading.   Final Clinical Impression(s) / ED Diagnoses Final diagnoses:  Postoperative infection, unspecified type, initial encounter    Rx / DC Orders ED Discharge Orders     None        Legrand Rams 09/05/20 1926    Gerhard Munch, MD 09/07/20 1630

## 2020-09-05 NOTE — ED Triage Notes (Signed)
Pt reports she woke up yesterday with a "hole" in her stomach and edema to Right arm pt reports drainage from her abd, N/V and abd discomfort. Pt had a tummy tuck, liposuction and muscle reconstruction in [place] 2 weeks.  Pt reports IV was in her Left arm but there is bruising, edema and redness to her Right arm. Pt has a hx of blood clots to her Left arm and Left leg. Takes [place] Ramp, stopped taking it May 30th, started back June 3rd has not been able to take any of her meds x2 days d/t vomiting.   Pt took 1gm tylenol prior to arrival   Unable to collect labs in triage d/t edema and multiple IV attempts to bilateral arms. They ended up having to put an IV in her neck during the surgery.  Pt warm to touch, tremors and chills

## 2020-09-05 NOTE — Progress Notes (Signed)
VAST consulted to obtain IV access. Bilateral arms assessed utilizing ultrasound. No appropriate vessels for USGIV. Bilateral arms slightly swollen and itchy. Pt reports having EJ placed for elective surgery 2 weeks ago.  Midline obtained in right basilic vein. Prior to line placement, area of red, irritated skin noted lateral to insertion site (appears to be where transparent dressing was placed previously). Pt reported it has been there since her surgery 2 weeks ago. ER RN notified of findings as well as PA.

## 2020-09-06 DIAGNOSIS — R1013 Epigastric pain: Secondary | ICD-10-CM | POA: Diagnosis present

## 2020-09-06 DIAGNOSIS — Z86718 Personal history of other venous thrombosis and embolism: Secondary | ICD-10-CM

## 2020-09-06 LAB — BASIC METABOLIC PANEL
Anion gap: 10 (ref 5–15)
BUN: 12 mg/dL (ref 6–20)
CO2: 28 mmol/L (ref 22–32)
Calcium: 8.6 mg/dL — ABNORMAL LOW (ref 8.9–10.3)
Chloride: 103 mmol/L (ref 98–111)
Creatinine, Ser: 0.86 mg/dL (ref 0.44–1.00)
GFR, Estimated: >60 mL/min (ref >60)
Glucose, Bld: 89 mg/dL (ref 70–99)
Potassium: 3.9 mmol/L (ref 3.5–5.1)
Sodium: 141 mmol/L (ref 135–145)

## 2020-09-06 LAB — URINALYSIS, ROUTINE W REFLEX MICROSCOPIC
Bacteria, UA: NONE SEEN
Bilirubin Urine: NEGATIVE
Glucose, UA: NEGATIVE mg/dL
Hgb urine dipstick: NEGATIVE
Ketones, ur: NEGATIVE mg/dL
Nitrite: NEGATIVE
Protein, ur: NEGATIVE mg/dL
Renal Epithelial: 10
Specific Gravity, Urine: 1.006 (ref 1.005–1.030)
pH: 7 (ref 5.0–8.0)

## 2020-09-06 LAB — RAPID URINE DRUG SCREEN, HOSP PERFORMED
Amphetamines: NOT DETECTED
Barbiturates: NOT DETECTED
Benzodiazepines: NOT DETECTED
Cocaine: NOT DETECTED
Opiates: POSITIVE — AB
Tetrahydrocannabinol: NOT DETECTED

## 2020-09-06 LAB — CBC
HCT: 31.3 % — ABNORMAL LOW (ref 36.0–46.0)
Hemoglobin: 9.8 g/dL — ABNORMAL LOW (ref 12.0–15.0)
MCH: 28.3 pg (ref 26.0–34.0)
MCHC: 31.3 g/dL (ref 30.0–36.0)
MCV: 90.5 fL (ref 80.0–100.0)
Platelets: 225 10*3/uL (ref 150–400)
RBC: 3.46 MIL/uL — ABNORMAL LOW (ref 3.87–5.11)
RDW: 15 % (ref 11.5–15.5)
WBC: 5.1 10*3/uL (ref 4.0–10.5)
nRBC: 0 % (ref 0.0–0.2)

## 2020-09-06 LAB — PROTIME-INR
INR: 1.1 (ref 0.8–1.2)
Prothrombin Time: 14.1 seconds (ref 11.4–15.2)

## 2020-09-06 LAB — APTT
aPTT: 57 seconds — ABNORMAL HIGH (ref 24–36)
aPTT: 73 seconds — ABNORMAL HIGH (ref 24–36)

## 2020-09-06 LAB — LACTIC ACID, PLASMA: Lactic Acid, Venous: 0.9 mmol/L (ref 0.5–1.9)

## 2020-09-06 LAB — HEPARIN LEVEL (UNFRACTIONATED): Heparin Unfractionated: >1.1 IU/mL — ABNORMAL HIGH (ref 0.30–0.70)

## 2020-09-06 LAB — HIV ANTIBODY (ROUTINE TESTING W REFLEX): HIV Screen 4th Generation wRfx: NONREACTIVE

## 2020-09-06 LAB — MRSA NEXT GEN BY PCR, NASAL: MRSA by PCR Next Gen: NOT DETECTED

## 2020-09-06 MED ORDER — MORPHINE SULFATE (PF) 2 MG/ML IV SOLN: 1.0000 mg | INTRAVENOUS | Status: DC | PRN

## 2020-09-06 MED ORDER — HYDROCODONE-ACETAMINOPHEN 5-325 MG PO TABS
1.0000 | ORAL_TABLET | Freq: Four times a day (QID) | ORAL | Status: DC | PRN
  Administered 2020-09-06: 1 via ORAL
  Filled 2020-09-06 (×2): qty 1

## 2020-09-06 MED ORDER — MORPHINE SULFATE (PF) 4 MG/ML IV SOLN
4.0000 mg | Freq: Once | INTRAVENOUS | Status: AC
  Administered 2020-09-06: 4 mg via INTRAVENOUS
  Filled 2020-09-06: qty 1

## 2020-09-06 MED ORDER — MORPHINE SULFATE (PF) 2 MG/ML IV SOLN: 2.0000 mg | INTRAVENOUS | Status: DC | PRN

## 2020-09-06 NOTE — Progress Notes (Signed)
Pt refused aptt from lab. MD aware.

## 2020-09-06 NOTE — Progress Notes (Signed)
Consulting civil engineer and myself went into the pt room this morning to explain that she only has Norco for pain because Morphine discontinue. Charge nurse ask the pt what do she take for pain while at home and she said nothing because all pain pills cause her stomach to be upset. MD has been made aware of the situation via text.  Suggest pt to tried clears to see if she can tolerate anything by mouth pt refuse because she afraid of vomiting again. Suggested that we give her Zofran, she stated "that makes her sick as well."   I had her lexapro to give but pt refuse to take because she has vomited since yesterday.

## 2020-09-06 NOTE — Progress Notes (Signed)
ANTICOAGULATION CONSULT NOTE  Pharmacy Consult:  Heparin Indication: Hx DVTs  Allergies  Allergen Reactions   Contrast Media [Iodinated Diagnostic Agents] Hives   Fentanyl Hives   Latex    Nsaids    Other Other (See Comments)    nuts   Phenergan [Promethazine] Hives and Itching    Patient received phenergan and dilaudid at same time, also had reaction earlier to zofran and dilaudid   Zofran [Ondansetron] Hives and Itching    Patient Measurements: Height: 5\' 7"  (170.2 cm) Weight: 92.5 kg (203 lb 14.8 oz) IBW/kg (Calculated) : 61.6 Heparin dosing weight = 82 kg  Vital Signs: Temp: 98.4 F (36.9 C) (06/15 0603) Temp Source: Oral (06/15 0603) BP: 149/81 (06/15 0642) Pulse Rate: 62 (06/15 0642)  Labs: Recent Labs    09/05/20 1753 09/06/20 0636  HGB 10.5* 9.8*  HCT 33.2* 31.3*  PLT 248 225  APTT  --  57*  LABPROT  --  14.1  INR  --  1.1  HEPARINUNFRC  --  >1.10*  CREATININE 0.87 0.86     Estimated Creatinine Clearance: 102.6 mL/min (by C-G formula based on SCr of 0.86 mg/dL).   Assessment: Amanda Bradley presenting with fever, abdominal pain and unable to take PO.  Patient has a history of DVTs on Xarelto PTA with last dose on 6/12.  Pharmacy consulted to bridge with IV heparin.  Currently using aPTT to guide heparin dosing because Xarelto is falsely elevating heparin levels.  APTT sub-therapeutic at 57 sec.  No bleeding reported.  Goal of Therapy:  Heparin level 0.3-0.7 units/ml aPTT 66-102 seconds Monitor platelets by anticoagulation protocol: Yes   Plan:  Increase heparin gtt to 1350 units/hr Check 6 hr aPTT Daily heparin level, aPTT and CBC  Lamone Ferrelli D. 8/12, PharmD, BCPS, BCCCP 09/06/2020, 8:39 AM

## 2020-09-06 NOTE — Progress Notes (Signed)
Offer pt jello or icee, pt decline and she refused pain meds.

## 2020-09-06 NOTE — Progress Notes (Signed)
PROGRESS NOTE    Amanda Bradley  LPF:[id number] DOB: 01/16/1981 DOA: 09/05/2020 PCP: Oneita Hurt, No   Brief Narrative:  Amanda Bradley is a 40 y.o. 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 Pt had abdominoplasty performed in [place] x2 weeks ago (August 23, 2020). Came to [place] to be with sister while she recovered. Pt presents to ED for c/o fever, abd pain, drainage from surgical site. Symptoms onset within 24 hours of admission.  Symptoms of nausea abdominal pain and questionable vomiting for 48 hours prior to admission.  Subjective fever at home 101.8.  On Xarelto but had to stop this from May 30-June 3 due to surgery.  In the ED patient had multiple rounds of IV narcotics as well as Zosyn, hospitalist consulted for admission due to poor pain control.    Assessment & Plan:   Principal Problem:   Post op infection Active Problems:   History of DVT (deep vein thrombosis)   Post op cellulitis of abdominal wall, POA -Without SIRS criteria, patient does not meet sepsis criteria  -CT scan reassuringly benign without drainable abscess or overt infectious process  -Continue empiric ceftriaxone -follow blood cultures, wound culture -patient reports completing course of cephalosporin in the outpatient setting postoperatively -Discontinue IV narcotics given no signs for infection, wound dehiscence or other clear indication for IV narcotics.  Transition to p.o. narcotics, today patient is refusing to take p.o. narcotics and demanding IV narcotics -we discussed that if she failed p.o. narcotics we could consider IV but patient has not had episode of vomiting in over 24 hours now -Wound does not appear overtly infected, small area laterally to the left with some dehiscence but without overt purulence or drainage at this point, good granulation tissue -no clear indication for surgical evaluation at this point -we will continue postop bandage changes as needed  Intractable nausea, vomiting  resolved -Patient has not had an episode of emesis now for greater than 24 hours, continue clear liquids, Phenergan -Patient unfortunately has multiple drug allergies including Zofran making symptom control somewhat difficult -Patient continues to refuse eating clear liquid diet today, we discussed the need for p.o. intake -Continue IV fluids until patient is willing to try sips and clears  H/o DVT - - No mention of DVT today on [place] of LUE when they put in IV access - Will put pt on heparin gtt until she is able to tolerate PO xarelto again   DVT prophylaxis: heparin gtt -transition back to Xarelto once able to tolerate p.o. Code Status: Full Family Communication: None  Status is: Inpatient  Dispo: The patient is from: Home              Anticipated d/c is to: Home              Anticipated d/c date is: 24 to 48 hours              Patient currently not medically stable for discharge  Consultants:  None  Procedures:  None  Antimicrobials:  Ceftriaxone  Subjective: No acute issues or events overnight, no further episodes of emesis for nearly 24 hours, patient continues to complain of profound nausea and intractable abdominal pain -refusing p.o. trial of sips or meds.  Objective: Vitals:   09/06/20 0130 09/06/20 0331 09/06/20 0603 09/06/20 0642  BP: 136/71 130/74 (!) 94/57 (!) 149/81  Pulse: 78 63 (!) 51 62  Resp: 19 20 17 20   Temp:  98.4 F (36.9 C) 98.4 F (36.9  C)   TempSrc:  Oral Oral   SpO2: 94% 96% 95% 99%  Weight:  92.5 kg    Height:  5\' 7"  (1.702 m)      Intake/Output Summary (Last 24 hours) at 09/06/2020 0725 Last data filed at 09/06/2020 0501 Gross per 24 hour  Intake 1746.66 ml  Output --  Net 1746.66 ml   Filed Weights   09/05/20 1801 09/06/20 0331  Weight: 81.6 kg 92.5 kg    Examination:  General:  Pleasantly resting in bed, No acute distress. HEENT:  Normocephalic atraumatic.  Sclerae nonicteric, noninjected.  Extraocular movements intact  bilaterally. Neck:  Without mass or deformity.  Trachea is midline. Lungs:  Clear to auscultate bilaterally without rhonchi, wheeze, or rales. Heart:  Regular rate and rhythm.  Without murmurs, rubs, or gallops. Abdomen: Large transabdominal scar, well-healing save for small 0.25 centimeter opening length of erythema without purulent drainage and good granulation tissue; abdomen diffusely tender without guarding. Extremities: Without cyanosis, clubbing, edema, or obvious deformity. Vascular:  Dorsalis pedis and posterior tibial pulses palpable bilaterally. Skin:  Warm and dry, no erythema, no ulcerations -see above for postop scar   Data Reviewed: I have personally reviewed following labs and imaging studies  CBC: Recent Labs  Lab 09/05/20 1753 09/06/20 0636  WBC 7.0 5.1  NEUTROABS 4.0  --   HGB 10.5* 9.8*  HCT 33.2* 31.3*  MCV 90.7 90.5  PLT 248 225   Basic Metabolic Panel: Recent Labs  Lab 09/05/20 1753  NA 138  K 3.6  CL 102  CO2 26  GLUCOSE 83  BUN 16  CREATININE 0.87  CALCIUM 9.0   GFR: Estimated Creatinine Clearance: 101.4 mL/min (by C-G formula based on SCr of 0.87 mg/dL). Liver Function Tests: Recent Labs  Lab 09/05/20 1753  AST 21  ALT 68*  ALKPHOS 50  BILITOT 0.6  PROT 6.6  ALBUMIN 3.4*   No results for input(s): LIPASE, AMYLASE in the last 168 hours. No results for input(s): AMMONIA in the last 168 hours. Coagulation Profile: No results for input(s): INR, PROTIME in the last 168 hours. Cardiac Enzymes: No results for input(s): CKTOTAL, CKMB, CKMBINDEX, TROPONINI in the last 168 hours. BNP (last 3 results) No results for input(s): PROBNP in the last 8760 hours. HbA1C: No results for input(s): HGBA1C in the last 72 hours. CBG: No results for input(s): GLUCAP in the last 168 hours. Lipid Profile: No results for input(s): CHOL, HDL, LDLCALC, TRIG, CHOLHDL, LDLDIRECT in the last 72 hours. Thyroid Function Tests: No results for input(s): TSH,  T4TOTAL, FREET4, T3FREE, THYROIDAB in the last 72 hours. Anemia Panel: No results for input(s): VITAMINB12, FOLATE, FERRITIN, TIBC, IRON, RETICCTPCT in the last 72 hours. Sepsis Labs: No results for input(s): PROCALCITON, LATICACIDVEN in the last 168 hours.  Recent Results (from the past 240 hour(s))  Resp Panel by RT-PCR (Flu A&B, Covid) Nasopharyngeal Swab     Status: None   Collection Time: 09/05/20  1:43 PM   Specimen: Nasopharyngeal Swab; Nasopharyngeal(NP) swabs in vial transport medium  Result Value Ref Range Status   SARS Coronavirus 2 by RT PCR NEGATIVE NEGATIVE Final    Comment: (NOTE) SARS-CoV-2 target nucleic acids are NOT DETECTED.  The SARS-CoV-2 RNA is generally detectable in upper respiratory specimens during the acute phase of infection. The lowest concentration of SARS-CoV-2 viral copies this assay can detect is 138 copies/mL. A negative result does not preclude SARS-Cov-2 infection and should not be used as the sole basis for treatment or  other patient management decisions. A negative result may occur with  improper specimen collection/handling, submission of specimen other than nasopharyngeal swab, presence of viral mutation(s) within the areas targeted by this assay, and inadequate number of viral copies(<138 copies/mL). A negative result must be combined with clinical observations, patient history, and epidemiological information. The expected result is Negative.  Fact Sheet for Patients:  BloggerCourse.com  Fact Sheet for Healthcare Providers:  SeriousBroker.it  This test is no t yet approved or cleared by the [place] FDA and  has been authorized for detection and/or diagnosis of SARS-CoV-2 by FDA under an Emergency Use Authorization (EUA). This EUA will remain  in effect (meaning this test can be used) for the duration of the COVID-19 declaration under Section 564(b)(1) of the Act, 21 U.S.C.section  360bbb-3(b)(1), unless the authorization is terminated  or revoked sooner.       Influenza A by PCR NEGATIVE NEGATIVE Final   Influenza B by PCR NEGATIVE NEGATIVE Final    Comment: (NOTE) The Xpert Xpress SARS-CoV-2/FLU/RSV plus assay is intended as an aid in the diagnosis of influenza from Nasopharyngeal swab specimens and should not be used as a sole basis for treatment. Nasal washings and aspirates are unacceptable for Xpert Xpress SARS-CoV-2/FLU/RSV testing.  Fact Sheet for Patients: BloggerCourse.com  Fact Sheet for Healthcare Providers: SeriousBroker.it  This test is not yet approved or cleared by the [place] FDA and has been authorized for detection and/or diagnosis of SARS-CoV-2 by FDA under an Emergency Use Authorization (EUA). This EUA will remain in effect (meaning this test can be used) for the duration of the COVID-19 declaration under Section 564(b)(1) of the Act, 21 U.S.C. section 360bbb-3(b)(1), unless the authorization is terminated or revoked.  Performed at [facility], [address] [place], [place] [address]   Culture, blood (Routine x 2)     Status: None (Preliminary result)   Collection Time: 09/05/20  5:53 PM   Specimen: BLOOD  Result Value Ref Range Status   Specimen Description BLOOD VENOUS  Final   Special Requests   Final    BOTTLES DRAWN AEROBIC AND ANAEROBIC Blood Culture adequate volume   Culture   Final    NO GROWTH < 12 HOURS Performed at [facility], [address], [place], [place] [address]    Report Status PENDING  Incomplete     Radiology Studies: CT ABDOMEN PELVIS WO CONTRAST  Result Date: 09/05/2020 CLINICAL DATA:  Abdominal pain and fever postoperatively. Recent liposuction. Drainage from the abdomen. Nausea and vomiting. EXAM: CT ABDOMEN AND PELVIS WITHOUT CONTRAST TECHNIQUE: Multidetector CT imaging of the abdomen and pelvis was performed following the  standard protocol without IV contrast. COMPARISON:  02/02/2020 FINDINGS: Lower chest: The lung bases are clear. Small esophageal hiatal hernia. Hepatobiliary: No focal liver abnormality is seen. No gallstones, gallbladder wall thickening, or biliary dilatation. Pancreas: Unremarkable. No pancreatic ductal dilatation or surrounding inflammatory changes. Spleen: Normal in size without focal abnormality. Adrenals/Urinary Tract: No adrenal gland nodules. Parenchymal stones in the left kidney. No hydronephrosis or hydroureter. No ureteral stones identified. Bladder is unremarkable. Stomach/Bowel: Stomach, small bowel, and colon are not abnormally distended. Stool fills the colon. No inflammatory changes are appreciated. Appendix is normal. Vascular/Lymphatic: No significant vascular findings are present. No enlarged abdominal or pelvic lymph nodes. Reproductive: Uterus and bilateral adnexa are unremarkable. Other: No free air or free fluid in the abdomen. Infiltration in the anterior abdominal subcutaneous fat with surface irregularity. This is likely postoperative  edema but could reflect cellulitis in the appropriate clinical setting. No loculated collection is identified. Minimal subcutaneous gas. Musculoskeletal: No acute or significant osseous findings. IMPRESSION: 1. Infiltration in the subcutaneous fat of the anterior abdominal wall, most likely postoperative. No loculated collections identified. 2. No evidence of bowel obstruction or inflammation. 3. Nonobstructing left renal stones. 4. Small esophageal hiatal hernia. Electronically Signed   By: Burman Nieves M.D.   On: 09/05/2020 19:24   DG Chest 2 View  Result Date: 09/05/2020 CLINICAL DATA:  Recent surgery.  Suspected sepsis. EXAM: CHEST - 2 VIEW COMPARISON:  None. FINDINGS: The cardiac silhouette, mediastinal and hilar contours are within normal limits. The lungs are clear of an acute process. Minimal streaky left basilar atelectasis. No pleural  effusions. The bony thorax is intact. IMPRESSION: Minimal left basilar atelectasis. No infiltrates, edema or effusions. Electronically Signed   By: Rudie Meyer M.D.   On: 09/05/2020 13:29   UE VENOUS DUPLEX [facility] 7 am - 7 pm)  Result Date: 09/05/2020 UPPER VENOUS STUDY  Patient Name:  Amanda Bradley  Date of Exam:   09/05/2020 Medical Rec #: [id number]     Accession #:    [id number] Date of Birth: 06-09-1980      Patient Gender: F Patient Age:   34Y Exam Location:  [facility] Procedure:      VAS [place] UPPER EXTREMITY VENOUS DUPLEX Referring Phys: [id number] KELSEY N FORD --------------------------------------------------------------------------------  Indications: Swelling Risk Factors: Surgery. Comparison Study: No prior studies. Performing Technologist: Chanda Busing RVT  Examination Guidelines: A complete evaluation includes B-mode imaging, spectral Doppler, color Doppler, and power Doppler as needed of all accessible portions of each vessel. Bilateral testing is considered an integral part of a complete examination. Limited examinations for reoccurring indications may be performed as noted.  Right Findings: +----------+------------+---------+-----------+----------+-------+ RIGHT     CompressiblePhasicitySpontaneousPropertiesSummary +----------+------------+---------+-----------+----------+-------+ IJV           Full       Yes       Yes                      +----------+------------+---------+-----------+----------+-------+ Subclavian    Full       Yes       Yes                      +----------+------------+---------+-----------+----------+-------+ Axillary      Full       Yes       Yes                      +----------+------------+---------+-----------+----------+-------+ Brachial      Full       Yes       Yes                      +----------+------------+---------+-----------+----------+-------+ Radial        Full                                           +----------+------------+---------+-----------+----------+-------+ Ulnar         Full                                          +----------+------------+---------+-----------+----------+-------+  Cephalic      None                                  Chronic +----------+------------+---------+-----------+----------+-------+ Basilic       Full                                          +----------+------------+---------+-----------+----------+-------+  Left Findings: +----------+------------+---------+-----------+----------+-------+ LEFT      CompressiblePhasicitySpontaneousPropertiesSummary +----------+------------+---------+-----------+----------+-------+ Subclavian    Full       Yes       Yes                      +----------+------------+---------+-----------+----------+-------+  Summary:  Right: No evidence of deep vein thrombosis in the upper extremity. 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  Left: No evidence of thrombosis in the subclavian.  *See table(s) above for measurements and observations.    Preliminary     Scheduled Meds:  escitalopram  20 mg Oral Daily   Continuous Infusions:  cefTRIAXone (ROCEPHIN)  IV Stopped (09/05/20 2241)   heparin 1,200 Units/hr (09/06/20 0501)   lactated ringers 100 mL/hr at 09/06/20 0501     LOS: 0 days   Time spent: 40min  Azucena FallenWilliam C Katiya Fike, DO [facility]  If 7PM-7AM, please contact night-coverage [email or web address]  09/06/2020, 7:25 AM

## 2020-09-06 NOTE — Progress Notes (Addendum)
ANTICOAGULATION CONSULT NOTE  Pharmacy Consult:  IV Heparin Indication: Hx DVTs  Allergies  Allergen Reactions   Fentanyl Anaphylaxis   Nsaids Anaphylaxis   Contrast Media [Iodinated Diagnostic Agents] Hives    Ok with benadryl   Latex    Other Other (See Comments)    peanuts   Zofran [Ondansetron] Hives and Itching    Patient Measurements: Height: 5\' 7"  (170.2 cm) Weight: 92.5 kg (203 lb 14.8 oz) IBW/kg (Calculated) : 61.6 Heparin dosing weight  81.7 kg  Vital Signs: Temp: 98.2 F (36.8 C) (06/15 1318) Temp Source: Oral (06/15 1318) BP: 151/93 (06/15 1318) Pulse Rate: 76 (06/15 1318)  Labs: Recent Labs    09/05/20 1753 09/06/20 0636 09/06/20 1737  HGB 10.5* 9.8*  --   HCT 33.2* 31.3*  --   PLT 248 225  --   APTT  --  57* 73*  LABPROT  --  14.1  --   INR  --  1.1  --   HEPARINUNFRC  --  >1.10*  --   CREATININE 0.87 0.86  --    Estimated Creatinine Clearance: 102.6 mL/min (by C-G formula based on SCr of 0.86 mg/dL).  Assessment: 40  yr old female presented with fever, abdominal pain and inability to take PO meds. Patient has a history of DVTs, on Xarelto PTA with last dose on 6/12.  Pharmacy was consulted to bridge with IV heparin. Given recent Xarelto exposure, will use aPTT to monitor anticoagulation until aPTT and heparin levels correlate.  aPTT ~8.5 hrs after heparin infusion was increased to 1350 units/hr was 73 sec, which is within the goal range for this pt (pt initially refused blood draw for ~6-hr aPTT, but ultimately consented for blood draw). Per RN, no issues with IV or bleeding observed.   Goal of Therapy:  Heparin level 0.3-0.7 units/ml aPTT 66-102 seconds Monitor platelets by anticoagulation protocol: Yes   Plan:  Continue heparin infusion at 1350 units/hr Check confirmatory aPTT and check heparin level in 6 hrs Monitor daily heparin level, aPTT and CBC Monitor for bleeding  8/12, PharmD, BCPS, [facility] Clinical Pharmacist 09/06/2020,  5:18 PM

## 2020-09-06 NOTE — Progress Notes (Signed)
Pt took Reglan and Norco, now complain of pain, b/c she vomited; there's no signs of vomiting. MD aware. Will continue to monitor.

## 2020-09-06 NOTE — ED Notes (Signed)
Pt ambulatory to and from restroom  

## 2020-09-07 ENCOUNTER — Other Ambulatory Visit (HOSPITAL_COMMUNITY): Payer: Self-pay

## 2020-09-07 DIAGNOSIS — T8141XA Infection following a procedure, superficial incisional surgical site, initial encounter: Secondary | ICD-10-CM

## 2020-09-07 MED ORDER — CEFDINIR 300 MG PO CAPS
300.0000 mg | ORAL_CAPSULE | Freq: Two times a day (BID) | ORAL | 0 refills | Status: AC
  Filled 2020-09-07: qty 10, 5d supply, fill #0

## 2020-09-07 MED ORDER — RIVAROXABAN 20 MG PO TABS: 20.0000 mg | ORAL_TABLET | Freq: Every day | ORAL | Status: DC

## 2020-09-07 MED ORDER — CEFPODOXIME PROXETIL 200 MG PO TABS
200.0000 mg | ORAL_TABLET | Freq: Two times a day (BID) | ORAL | 0 refills | Status: DC
Start: 2020-09-07 — End: 2020-09-07
  Filled 2020-09-07: qty 10, 5d supply, fill #0

## 2020-09-07 MED ORDER — PROMETHAZINE HCL 12.5 MG PO TABS
12.5000 mg | ORAL_TABLET | Freq: Four times a day (QID) | ORAL | 1 refills | Status: AC | PRN
  Filled 2020-09-07: qty 30, 8d supply, fill #0

## 2020-09-07 MED ORDER — TRAMADOL HCL 50 MG PO TABS: 50.0000 mg | ORAL_TABLET | Freq: Four times a day (QID) | ORAL | Status: DC | PRN

## 2020-09-07 MED ORDER — OXYCODONE HCL 5 MG PO TABS
5.0000 mg | ORAL_TABLET | Freq: Two times a day (BID) | ORAL | 0 refills | Status: DC | PRN
  Filled 2020-09-07: qty 10, 5d supply, fill #0

## 2020-09-07 NOTE — Progress Notes (Signed)
Pt is ready to go home with sister who is a Engineer, civil (consulting) at [employer].  TOC for meds. Educated pt on how to dress tiny open wound left lower abd area, used iodoform, 1/4" long and covered with square foam.

## 2020-09-07 NOTE — Plan of Care (Signed)
Pt has refused any offer of PRN Norco for pain stating "I just want to sleep until morning when I can go home."  No episodes of nausea/vomiting voiced or observed by nursing staff.  Pt has also refused any po intake tonight.  IVF continue.  Pt has also refused to have labs drawn this AM stating "maybe later".  Hilton Sinclair BSN RN CMSRN   Problem: Nutrition: Goal: Adequate nutrition will be maintained Outcome: Not Progressing   Problem: Pain Managment: Goal: General experience of comfort will improve Outcome: Not Progressing

## 2020-09-07 NOTE — Progress Notes (Signed)
Pt states she is ready to go home.  Called Dr. Maryfrances Bunnell and he will come up and see pt and put in her DC papers.  Ordered iodoform for pt per Dr. Maryfrances Bunnell.

## 2020-09-10 LAB — CULTURE, BLOOD (ROUTINE X 2)
Culture: NO GROWTH
Special Requests: ADEQUATE

## 2020-09-11 NOTE — Discharge Summary (Signed)
Physician Discharge Summary  Amanda Bradley QIH:[id number] DOB: March 01, 1981 DOA: 09/05/2020  PCP: Pcp, No  Admit date: 09/05/2020 Discharge date: 09/07/2020  Admitted From: Home  Disposition:  Home   Recommendations for Outpatient Follow-up:  Establish with PCP     Home Health: None  Equipment/Devices: None   Discharge Condition: Good  CODE STATUS: FULL Diet recommendation: Regular  Brief/Interim Summary: Ms Amanda Bradley is a 40 y.o. F with hx VTE on Xarelto and recent abdominoplasty in [place] 2 weeks prior to admission who presented with pain, fever, drainage from incision.  Had come to [place] to be near sister who lives here.  While here, started to notice some drainage from her umbilical incision and that small dehiscence at left end of her abdomen incision, associated with pain and fever.   In the ER, she was requiring multiple doses of IV opiate and so admission was requested.     PRINCIPAL HOSPITAL DIAGNOSIS: Possible superficial surgical site infection    Discharge Diagnoses:  Possible superficial surgical site infection The wound had about 1cm of dehiscence on the left edge.  There was imaging done with CT that showed no abscess or fluid collection.  The wound appeared possibly infected at the time of admission and had some drainage and so antibiotics were started.  There were no systemic signs, she had no leukocytosis or recorded fever here.  She was treated with 48 hours IV antibiotics.  The patient complained of nausea, and refused food, some medications, lab draws for heparin monitoring, and antiemetics.  On the morning of discharge, her wound appeared clean and dry.  There was a small, non-enlarging area of dehiscence as previously described, with minimal serous drainage and no surrounding clinical signs of cellulitis. She was mentating at baseline, afebrile thorughout stay, HR normal, RR normal.   She was taught how to carefully dress her wound and given return  precautions.    History of venous thromboembolism Patient reported taking chronic Xarelto.  She was initially started on heparin gtt due to reports of vomiting.            Discharge Instructions  Discharge Instructions     Diet - low sodium heart healthy   Complete by: As directed    Discharge instructions   Complete by: As directed    From Dr. Maryfrances Bunnell: You were admitted for pain and dehiscence of your wound.  It looked like the wound was slightly infected. We call this "cellulitis".  You should call your surgeon, but you can reassure him that it was very mild, and that only about 1-1.5cm were dehisced, very shallow.  Pack very gently with iodoform.  It should scab over.  You may wash with warm water and pat dry.    Continue your pain medicines as your surgeon prescribed.  For the infection: take cefdinir 300 mg (1 pill) twice daily Start tonight and take every 12 hours (twice daily) until gone   Discharge wound care:   Complete by: As directed    Place iodoform gently and trim excess. Cover with simple bandage.      Allergies as of 09/07/2020       Reactions   Fentanyl Anaphylaxis   Nsaids Anaphylaxis   Contrast Media [iodinated Diagnostic Agents] Hives   Ok with benadryl   Latex    Other Other (See Comments)   peanuts   Zofran [ondansetron] Hives, Itching        Medication List     TAKE these medications  cefdinir 300 MG capsule Commonly known as: OMNICEF Take 1 capsule (300 mg total) by mouth 2 (two) times daily.   escitalopram 20 MG tablet Commonly known as: LEXAPRO Take 20 mg by mouth daily.   promethazine 12.5 MG tablet Commonly known as: PHENERGAN Take 1 tablet (12.5 mg total) by mouth every 6 (six) hours as needed for nausea or vomiting.   rivaroxaban 20 MG Tabs tablet Commonly known as: XARELTO Take 20 mg by mouth daily with supper.               Discharge Care Instructions  (From admission, onward)           Start      Ordered   09/07/20 0000  Discharge wound care:       Comments: Place iodoform gently and trim excess. Cover with simple bandage.   09/07/20 1349            Allergies  Allergen Reactions   Fentanyl Anaphylaxis   Nsaids Anaphylaxis   Contrast Media [Iodinated Diagnostic Agents] Hives    Ok with benadryl   Latex    Other Other (See Comments)    peanuts   Zofran [Ondansetron] Hives and Itching    Consultations: None   Procedures/Studies: CT ABDOMEN PELVIS WO CONTRAST  Result Date: 09/05/2020 CLINICAL DATA:  Abdominal pain and fever postoperatively. Recent liposuction. Drainage from the abdomen. Nausea and vomiting. EXAM: CT ABDOMEN AND PELVIS WITHOUT CONTRAST TECHNIQUE: Multidetector CT imaging of the abdomen and pelvis was performed following the standard protocol without IV contrast. COMPARISON:  02/02/2020 FINDINGS: Lower chest: The lung bases are clear. Small esophageal hiatal hernia. Hepatobiliary: No focal liver abnormality is seen. No gallstones, gallbladder wall thickening, or biliary dilatation. Pancreas: Unremarkable. No pancreatic ductal dilatation or surrounding inflammatory changes. Spleen: Normal in size without focal abnormality. Adrenals/Urinary Tract: No adrenal gland nodules. Parenchymal stones in the left kidney. No hydronephrosis or hydroureter. No ureteral stones identified. Bladder is unremarkable. Stomach/Bowel: Stomach, small bowel, and colon are not abnormally distended. Stool fills the colon. No inflammatory changes are appreciated. Appendix is normal. Vascular/Lymphatic: No significant vascular findings are present. No enlarged abdominal or pelvic lymph nodes. Reproductive: Uterus and bilateral adnexa are unremarkable. Other: No free air or free fluid in the abdomen. Infiltration in the anterior abdominal subcutaneous fat with surface irregularity. This is likely postoperative edema but could reflect cellulitis in the appropriate clinical setting. No loculated  collection is identified. Minimal subcutaneous gas. Musculoskeletal: No acute or significant osseous findings. IMPRESSION: 1. Infiltration in the subcutaneous fat of the anterior abdominal wall, most likely postoperative. No loculated collections identified. 2. No evidence of bowel obstruction or inflammation. 3. Nonobstructing left renal stones. 4. Small esophageal hiatal hernia. Electronically Signed   By: Burman Nieves M.D.   On: 09/05/2020 19:24   DG Chest 2 View  Result Date: 09/05/2020 CLINICAL DATA:  Recent surgery.  Suspected sepsis. EXAM: CHEST - 2 VIEW COMPARISON:  None. FINDINGS: The cardiac silhouette, mediastinal and hilar contours are within normal limits. The lungs are clear of an acute process. Minimal streaky left basilar atelectasis. No pleural effusions. The bony thorax is intact. IMPRESSION: Minimal left basilar atelectasis. No infiltrates, edema or effusions. Electronically Signed   By: Rudie Meyer M.D.   On: 09/05/2020 13:29   UE VENOUS DUPLEX ([facility] & [facility] 7 am - 7 pm)  Result Date: 09/06/2020 UPPER VENOUS STUDY  Patient Name:  Amanda Bradley  Date of Exam:   09/05/2020 Medical Rec #:  [id number]     Accession #:    [id number] Date of Birth: 03-07-1981      Patient Gender: F Patient Age:   59Y Exam Location:  [facility] Procedure:      VAS [place] UPPER EXTREMITY VENOUS DUPLEX Referring Phys: [id number] KELSEY N FORD --------------------------------------------------------------------------------  Indications: Swelling Risk Factors: Surgery. Comparison Study: No prior studies. Performing Technologist: Chanda Busing RVT  Examination Guidelines: A complete evaluation includes B-mode imaging, spectral Doppler, color Doppler, and power Doppler as needed of all accessible portions of each vessel. Bilateral testing is considered an integral part of a complete examination. Limited examinations for reoccurring indications may be performed as noted.  Right Findings:  +----------+------------+---------+-----------+----------+-------+ RIGHT     CompressiblePhasicitySpontaneousPropertiesSummary +----------+------------+---------+-----------+----------+-------+ IJV           Full       Yes       Yes                      +----------+------------+---------+-----------+----------+-------+ Subclavian    Full       Yes       Yes                      +----------+------------+---------+-----------+----------+-------+ Axillary      Full       Yes       Yes                      +----------+------------+---------+-----------+----------+-------+ Brachial      Full       Yes       Yes                      +----------+------------+---------+-----------+----------+-------+ Radial        Full                                          +----------+------------+---------+-----------+----------+-------+ Ulnar         Full                                          +----------+------------+---------+-----------+----------+-------+ Cephalic      None                                  Chronic +----------+------------+---------+-----------+----------+-------+ Basilic       Full                                          +----------+------------+---------+-----------+----------+-------+  Left Findings: +----------+------------+---------+-----------+----------+-------+ LEFT      CompressiblePhasicitySpontaneousPropertiesSummary +----------+------------+---------+-----------+----------+-------+ Subclavian    Full       Yes       Yes                      +----------+------------+---------+-----------+----------+-------+  Summary:  Right: No evidence of deep vein thrombosis in the upper extremity. 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  Left: No evidence of thrombosis in the subclavian.  *See table(s) above for measurements and observations.  Diagnosing physician: Fabienne Bruns MD  Electronically signed by Fabienne Bruns MD on 09/06/2020 at 8:44:58 AM.    Final       Subjective: Feeling tired.  Has pain around her incision.    Discharge Exam: Vitals:   09/06/20 2123 09/07/20 0430  BP: (!) 138/92 (!) 144/78  Pulse: (!) 58 66  Resp: 17 18  Temp: 98.6 F (37 C) 98.6 F (37 C)  SpO2: 97% 100%   Vitals:   09/06/20 0947 09/06/20 1318 09/06/20 2123 09/07/20 0430  BP: 137/68 (!) 151/93 (!) 138/92 (!) 144/78  Pulse: (!) 58 76 (!) 58 66  Resp: 18 19 17 18   Temp: 97.8 F (36.6 C) 98.2 F (36.8 C) 98.6 F (37 C) 98.6 F (37 C)  TempSrc: Oral Oral Oral Oral  SpO2: 99% 100% 97% 100%  Weight:      Height:        General: Pt is alert, awake, not in acute distress Cardiovascular: RRR, nl S1-S2, no murmurs appreciated.   No LE edema.   Respiratory: Normal respiratory rate and rhythm.  CTAB without rales or wheezes. Abdominal: Abdomen with voluntary guarding.  The incision is clean and dry.  There is a small area of dehiscence without surrounding cellulitis, no fluctuance, no abnormal drainage.  No distension or HSM.   Neuro/Psych: Strength symmetric in upper and lower extremities.  Judgment and insight appear normal.   The results of significant diagnostics from this hospitalization (including imaging, microbiology, ancillary and laboratory) are listed below for reference.     Microbiology: Recent Results (from the past 240 hour(s))  Resp Panel by RT-PCR (Flu A&B, Covid) Nasopharyngeal Swab     Status: None   Collection Time: 09/05/20  1:43 PM   Specimen: Nasopharyngeal Swab; Nasopharyngeal(NP) swabs in vial transport medium  Result Value Ref Range Status   SARS Coronavirus 2 by RT PCR NEGATIVE NEGATIVE Final    Comment: (NOTE) SARS-CoV-2 target nucleic acids are NOT DETECTED.  The SARS-CoV-2 RNA is generally detectable in upper respiratory specimens during the acute phase of infection. The lowest concentration of SARS-CoV-2 viral copies this assay can  detect is 138 copies/mL. A negative result does not preclude SARS-Cov-2 infection and should not be used as the sole basis for treatment or other patient management decisions. A negative result may occur with  improper specimen collection/handling, submission of specimen other than nasopharyngeal swab, presence of viral mutation(s) within the areas targeted by this assay, and inadequate number of viral copies(<138 copies/mL). A negative result must be combined with clinical observations, patient history, and epidemiological information. The expected result is Negative.  Fact Sheet for Patients:  09/07/20  Fact Sheet for Healthcare Providers:  BloggerCourse.com  This test is no t yet approved or cleared by the [place] FDA and  has been authorized for detection and/or diagnosis of SARS-CoV-2 by FDA under an Emergency Use Authorization (EUA). This EUA will remain  in effect (meaning this test can be used) for the duration of the COVID-19 declaration under Section 564(b)(1) of the Act, 21 U.S.C.section 360bbb-3(b)(1), unless the authorization is terminated  or revoked sooner.       Influenza A by PCR NEGATIVE NEGATIVE Final   Influenza B by PCR NEGATIVE NEGATIVE Final    Comment: (NOTE) The Xpert Xpress SARS-CoV-2/FLU/RSV plus assay is intended as an aid in the diagnosis of influenza from Nasopharyngeal swab specimens and should not be used as a sole basis for treatment. Nasal washings and aspirates are unacceptable for Xpert Xpress SARS-CoV-2/FLU/RSV testing.  Fact Sheet for Patients: BloggerCourse.com  Fact Sheet for Healthcare Providers: SeriousBroker.it  This test is not yet approved or cleared by the [place] FDA and has been authorized for detection and/or diagnosis of SARS-CoV-2 by FDA under an Emergency Use Authorization (EUA). This EUA will remain in  effect (meaning this test can be used) for the duration of the COVID-19 declaration under Section 564(b)(1) of the Act, 21 U.S.C. section 360bbb-3(b)(1), unless the authorization is terminated or revoked.  Performed at [facility], [address], [place], [place] [address]   Culture, blood (Routine x 2)     Status: None   Collection Time: 09/05/20  5:53 PM   Specimen: BLOOD  Result Value Ref Range Status   Specimen Description BLOOD VENOUS  Final   Special Requests   Final    BOTTLES DRAWN AEROBIC AND ANAEROBIC Blood Culture adequate volume   Culture   Final    NO GROWTH 5 DAYS Performed at [facility], [address] [place], [place] [address]    Report Status 09/10/2020 FINAL  Final  MRSA Next Gen by PCR, Nasal     Status: None   Collection Time: 09/06/20  6:57 AM   Specimen: Nasal Mucosa; Nasal Swab  Result Value Ref Range Status   MRSA by PCR Next Gen NOT DETECTED NOT DETECTED Final    Comment: (NOTE) The GeneXpert MRSA Assay (FDA approved for NASAL specimens only), is one component of a comprehensive MRSA colonization surveillance program. It is not intended to diagnose MRSA infection nor to guide or monitor treatment for MRSA infections. Test performance is not FDA approved in patients less than 61 years old. Performed at [facility], [address] [place], [place] [address]      Labs: BNP (last 3 results) No results for input(s): BNP in the last 8760 hours. Basic Metabolic Panel: Recent Labs  Lab 09/05/20 1753 09/06/20 0636  NA 138 141  K 3.6 3.9  CL 102 103  CO2 26 28  GLUCOSE 83 89  BUN 16 12  CREATININE 0.87 0.86  CALCIUM 9.0 8.6*   Liver Function Tests: Recent Labs  Lab 09/05/20 1753  AST 21  ALT 68*  ALKPHOS 50  BILITOT 0.6  PROT 6.6  ALBUMIN 3.4*   No results for input(s): LIPASE, AMYLASE in the last 168 hours. No results for input(s): AMMONIA in the last 168 hours. CBC: Recent Labs  Lab 09/05/20 1753  09/06/20 0636  WBC 7.0 5.1  NEUTROABS 4.0  --   HGB 10.5* 9.8*  HCT 33.2* 31.3*  MCV 90.7 90.5  PLT 248 225   Cardiac Enzymes: No results for input(s): CKTOTAL, CKMB, CKMBINDEX, TROPONINI in the last 168 hours. BNP: Invalid input(s): POCBNP CBG: No results for input(s): GLUCAP in the last 168 hours. D-Dimer No results for input(s): DDIMER in the last 72 hours. Hgb A1c No results for input(s): HGBA1C in the last 72 hours. Lipid Profile No results for input(s): CHOL, HDL, LDLCALC, TRIG, CHOLHDL, LDLDIRECT in the last 72 hours. Thyroid function studies No results for input(s): TSH, T4TOTAL, T3FREE, THYROIDAB in the last 72 hours.  Invalid input(s): FREET3 Anemia work up No results for input(s): VITAMINB12, FOLATE, FERRITIN, TIBC, IRON, RETICCTPCT in the last 72 hours. Urinalysis    Component Value Date/Time   COLORURINE STRAW (A) 09/06/2020 1344   APPEARANCEUR CLEAR 09/06/2020 1344   LABSPEC 1.006 09/06/2020 1344   PHURINE 7.0 09/06/2020 1344   GLUCOSEU NEGATIVE 09/06/2020  1344   HGBUR NEGATIVE 09/06/2020 1344   BILIRUBINUR NEGATIVE 09/06/2020 1344   KETONESUR NEGATIVE 09/06/2020 1344   PROTEINUR NEGATIVE 09/06/2020 1344   NITRITE NEGATIVE 09/06/2020 1344   LEUKOCYTESUR LARGE (A) 09/06/2020 1344   Sepsis Labs Invalid input(s): PROCALCITONIN,  WBC,  LACTICIDVEN Microbiology Recent Results (from the past 240 hour(s))  Resp Panel by RT-PCR (Flu A&B, Covid) Nasopharyngeal Swab     Status: None   Collection Time: 09/05/20  1:43 PM   Specimen: Nasopharyngeal Swab; Nasopharyngeal(NP) swabs in vial transport medium  Result Value Ref Range Status   SARS Coronavirus 2 by RT PCR NEGATIVE NEGATIVE Final    Comment: (NOTE) SARS-CoV-2 target nucleic acids are NOT DETECTED.  The SARS-CoV-2 RNA is generally detectable in upper respiratory specimens during the acute phase of infection. The lowest concentration of SARS-CoV-2 viral copies this assay can detect is 138 copies/mL. A  negative result does not preclude SARS-Cov-2 infection and should not be used as the sole basis for treatment or other patient management decisions. A negative result may occur with  improper specimen collection/handling, submission of specimen other than nasopharyngeal swab, presence of viral mutation(s) within the areas targeted by this assay, and inadequate number of viral copies(<138 copies/mL). A negative result must be combined with clinical observations, patient history, and epidemiological information. The expected result is Negative.  Fact Sheet for Patients:  BloggerCourse.comhttps://www.fda.gov/media/152166/download  Fact Sheet for Healthcare Providers:  SeriousBroker.ithttps://www.fda.gov/media/152162/download  This test is no t yet approved or cleared by the [place] FDA and  has been authorized for detection and/or diagnosis of SARS-CoV-2 by FDA under an Emergency Use Authorization (EUA). This EUA will remain  in effect (meaning this test can be used) for the duration of the COVID-19 declaration under Section 564(b)(1) of the Act, 21 U.S.C.section 360bbb-3(b)(1), unless the authorization is terminated  or revoked sooner.       Influenza A by PCR NEGATIVE NEGATIVE Final   Influenza B by PCR NEGATIVE NEGATIVE Final    Comment: (NOTE) The Xpert Xpress SARS-CoV-2/FLU/RSV plus assay is intended as an aid in the diagnosis of influenza from Nasopharyngeal swab specimens and should not be used as a sole basis for treatment. Nasal washings and aspirates are unacceptable for Xpert Xpress SARS-CoV-2/FLU/RSV testing.  Fact Sheet for Patients: BloggerCourse.comhttps://www.fda.gov/media/152166/download  Fact Sheet for Healthcare Providers: SeriousBroker.ithttps://www.fda.gov/media/152162/download  This test is not yet approved or cleared by the [place] FDA and has been authorized for detection and/or diagnosis of SARS-CoV-2 by FDA under an Emergency Use Authorization (EUA). This EUA will remain in effect (meaning this test can  be used) for the duration of the COVID-19 declaration under Section 564(b)(1) of the Act, 21 U.S.C. section 360bbb-3(b)(1), unless the authorization is terminated or revoked.  Performed at [facility], [address] [place], [place] [address]   Culture, blood (Routine x 2)     Status: None   Collection Time: 09/05/20  5:53 PM   Specimen: BLOOD  Result Value Ref Range Status   Specimen Description BLOOD VENOUS  Final   Special Requests   Final    BOTTLES DRAWN AEROBIC AND ANAEROBIC Blood Culture adequate volume   Culture   Final    NO GROWTH 5 DAYS Performed at [facility], [address] [place], [place] [address]    Report Status 09/10/2020 FINAL  Final  MRSA Next Gen by PCR, Nasal     Status: None   Collection Time: 09/06/20  6:57 AM   Specimen:  Nasal Mucosa; Nasal Swab  Result Value Ref Range Status   MRSA by PCR Next Gen NOT DETECTED NOT DETECTED Final    Comment: (NOTE) The GeneXpert MRSA Assay (FDA approved for NASAL specimens only), is one component of a comprehensive MRSA colonization surveillance program. It is not intended to diagnose MRSA infection nor to guide or monitor treatment for MRSA infections. Test performance is not FDA approved in patients less than 74 years old. Performed at [facility], [address] [place], [place] [address]      Time coordinating discharge: 45 minutes The [place] controlled substances registry was reviewed for this patient    30 Day Unplanned Readmission Risk Score    Flowsheet Row ED to Hosp-Admission (Discharged) from 09/05/2020 in [facility]  SURGICAL  30 Day Unplanned Readmission Risk Score (%) 10.22 Filed at 09/07/2020 1200       This score is the patient's risk of an unplanned readmission within 30 days of being discharged (0 -100%). The score is based on dignosis, age, lab data, medications, orders, and past utilization.   Low:  0-14.9   Medium: 15-21.9   High: 22-29.9    Extreme: 30 and above             SIGNED:   Alberteen Sam, MD  [facility] 09/07/2020, 7:42 PM

## 2022-11-13 IMAGING — CR DG CHEST 2V
2 series · 2 of 2 positions shown · non-contrast
Comparison: None.

CLINICAL DATA: Recent surgery.  Suspected sepsis.

EXAM:
CHEST - 2 VIEW

[chest lat]
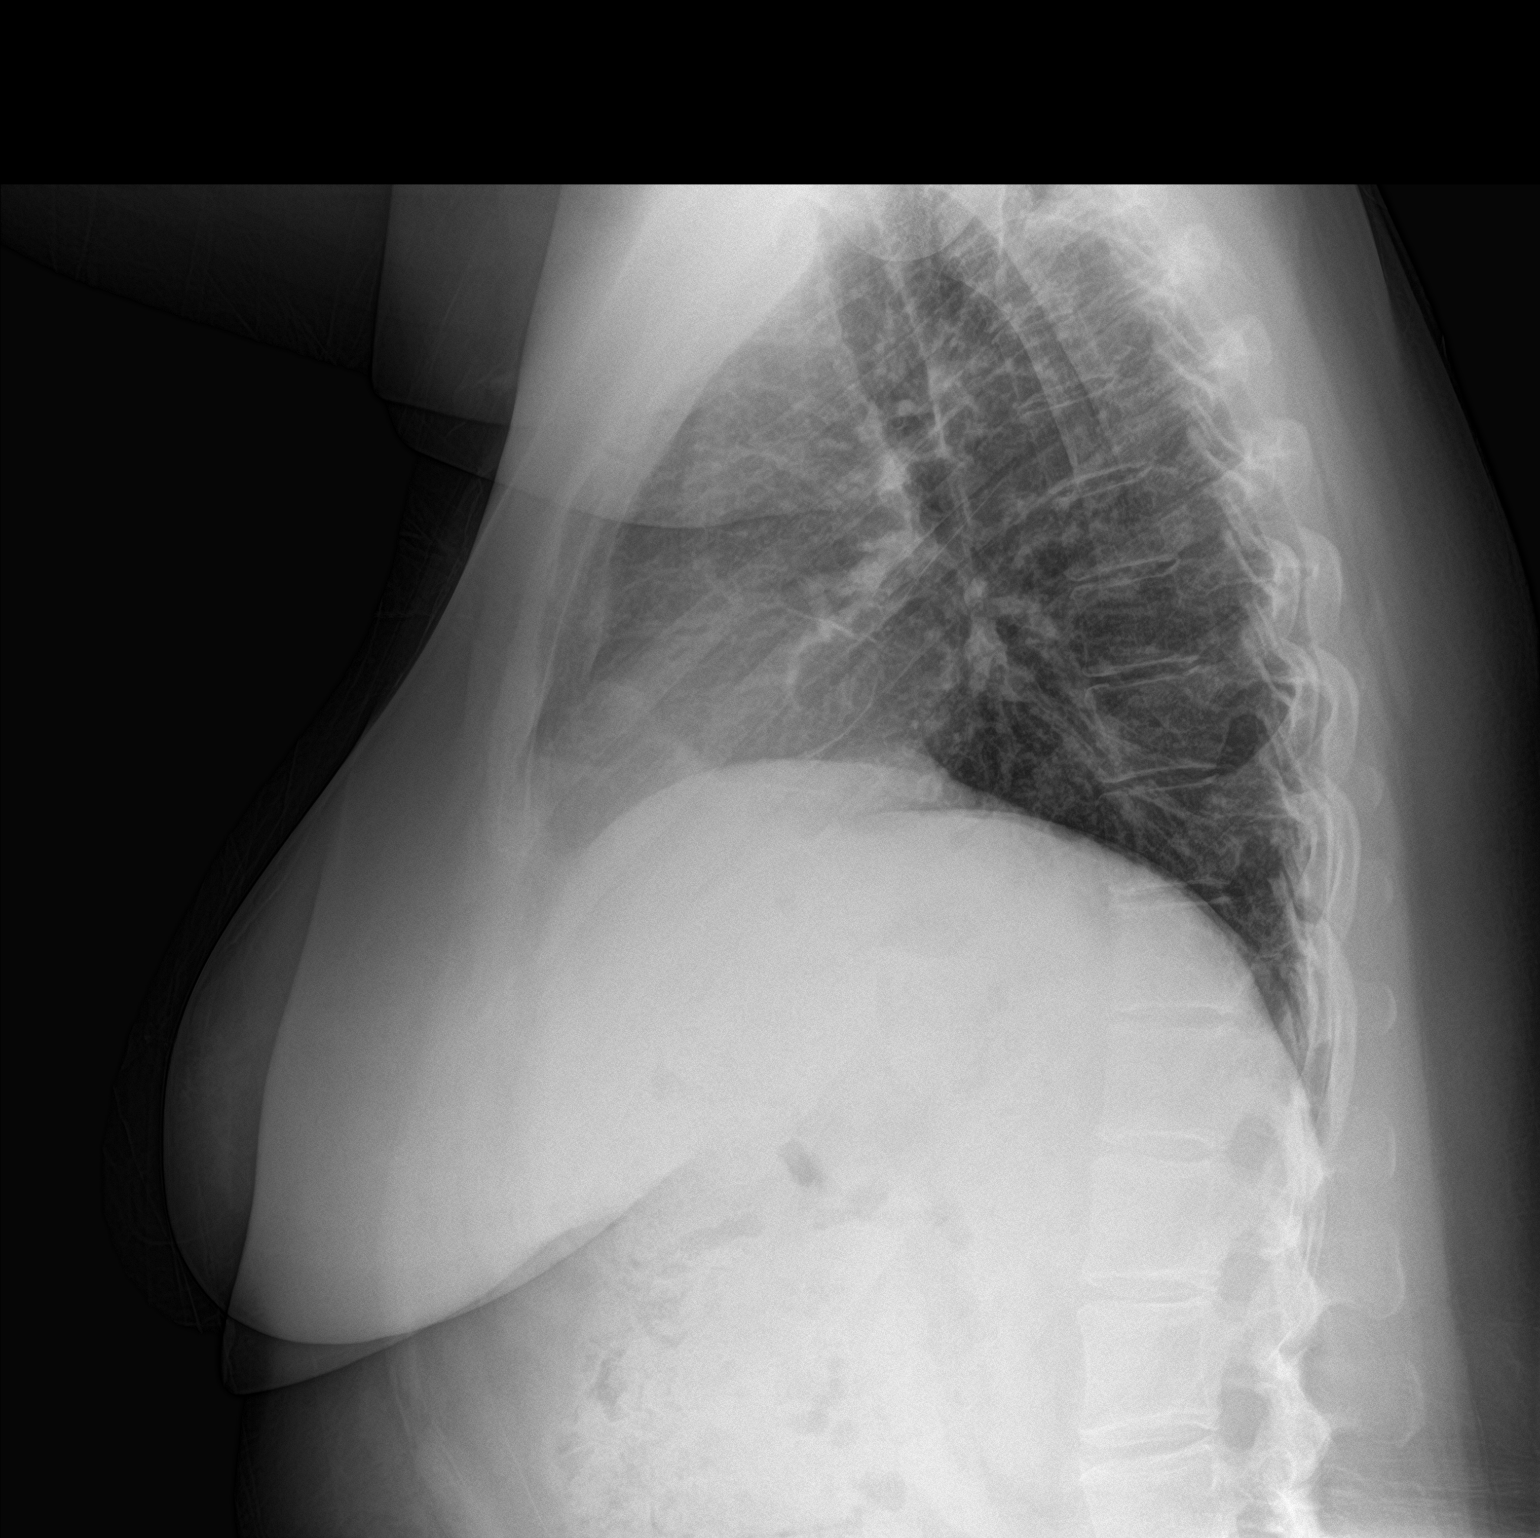

[chest ap]
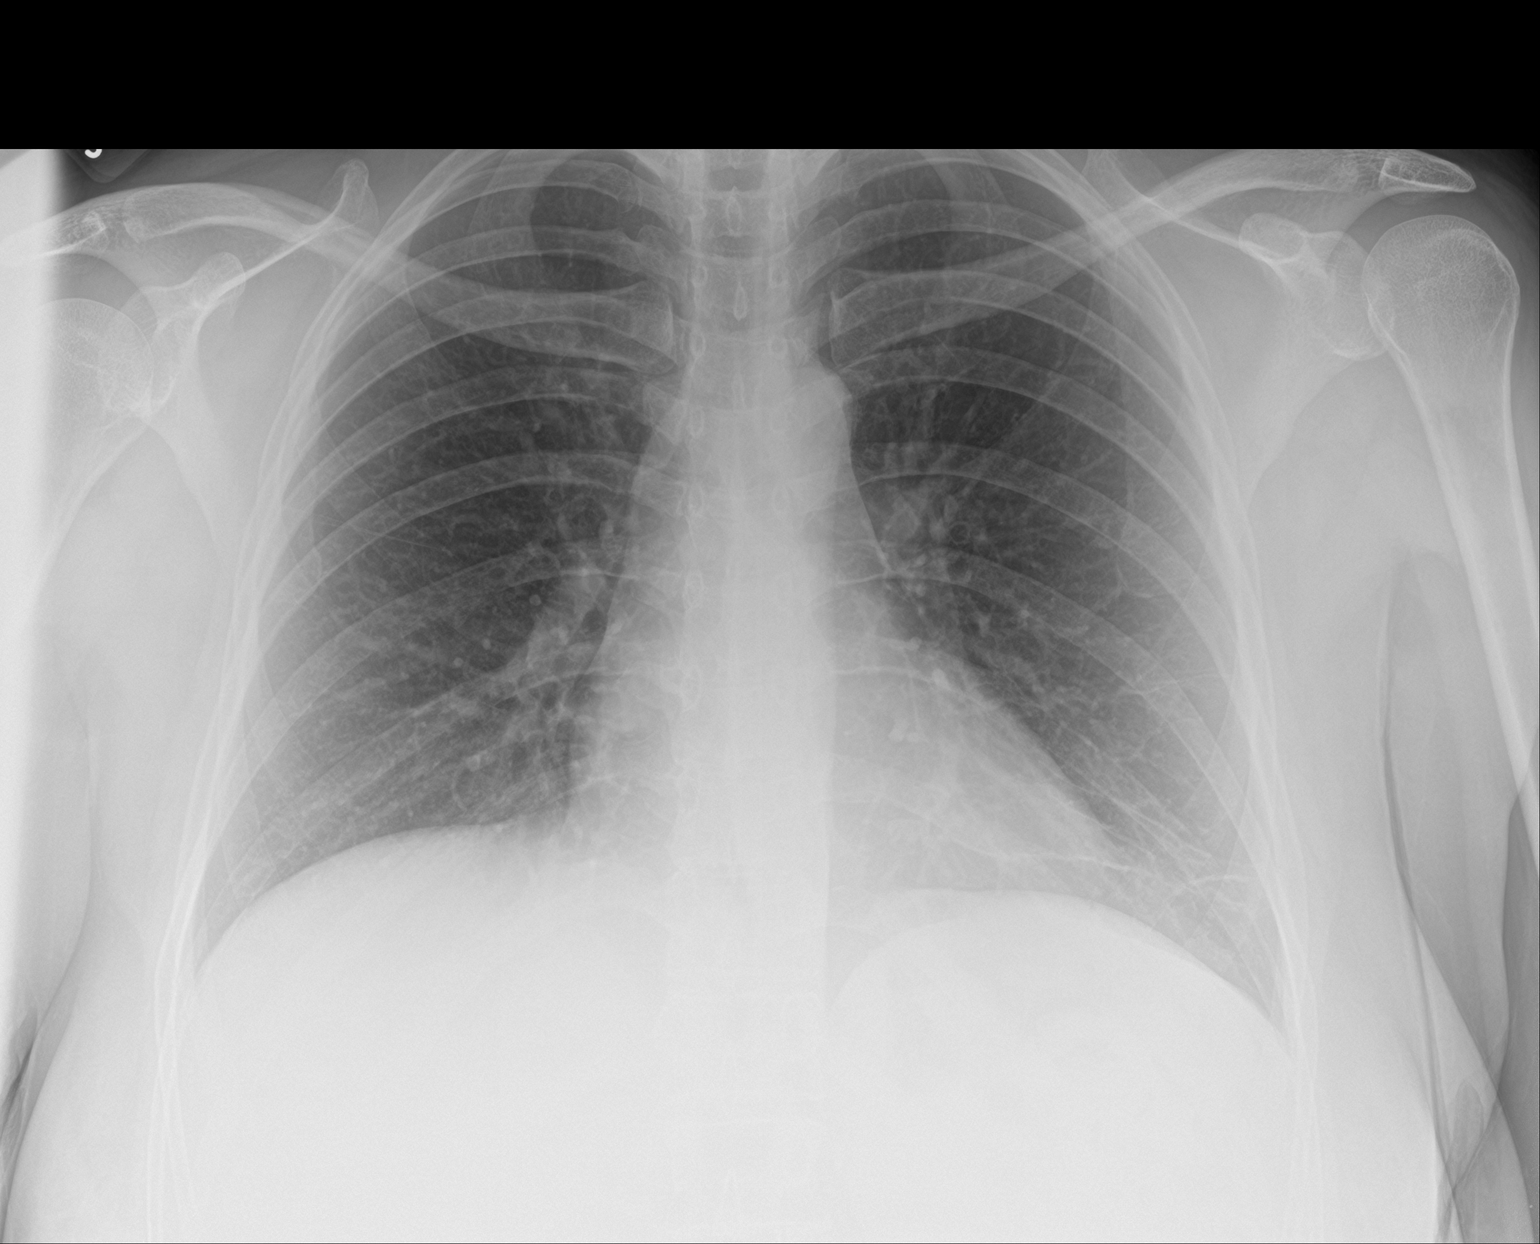

[2 of 2 positions shown; findings below may reference images not displayed]

FINDINGS: The cardiac silhouette, mediastinal and hilar contours are within
normal limits. The lungs are clear of an acute process. Minimal
streaky left basilar atelectasis. No pleural effusions. The bony
thorax is intact.
IMPRESSION: Minimal left basilar atelectasis. No infiltrates, edema or
effusions.

## 2022-11-13 IMAGING — CT CT ABD-PELV W/O CM
2 of 4 series · 16 of 46 positions shown, 18 images · non-contrast
Comparison: 02/02/2020

CLINICAL DATA: Abdominal pain and fever postoperatively. Recent
liposuction. Drainage from the abdomen. Nausea and vomiting.

EXAM:
CT ABDOMEN AND PELVIS WITHOUT CONTRAST
TECHNIQUE: Multidetector CT imaging of the abdomen and pelvis was performed
following the standard protocol without IV contrast.

[Series 3: abd/ pelvis 5.0 i30f 2 · axial · 0.98mm/px · z∈[+576,+1016]mm · 13 of 98 slices shown, 15 images]
[im 5/98  soft-tissue]
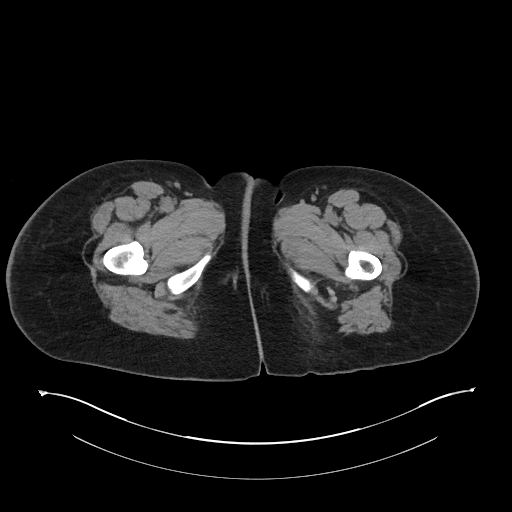
[im 5/98  bone]
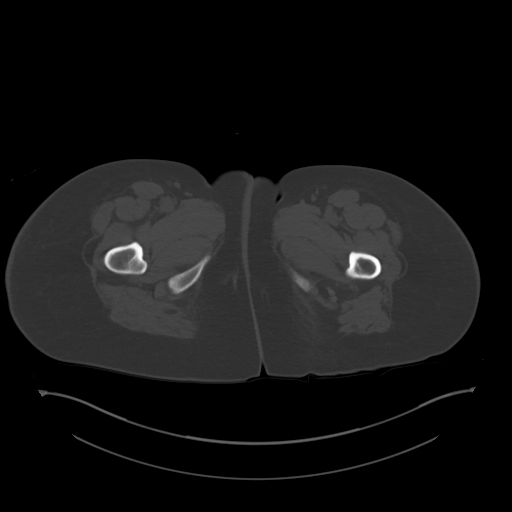
[im 13/98  soft-tissue]
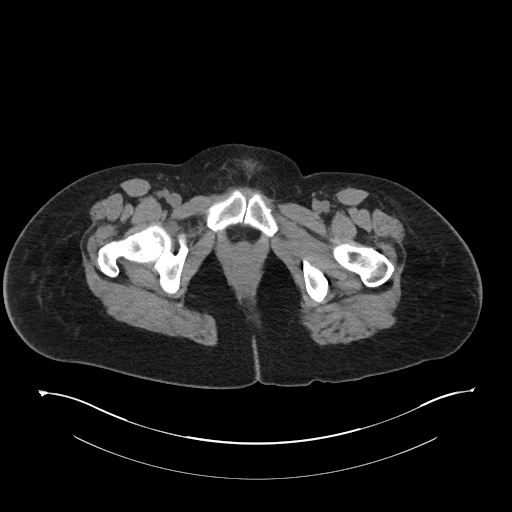
[im 21/98  soft-tissue]
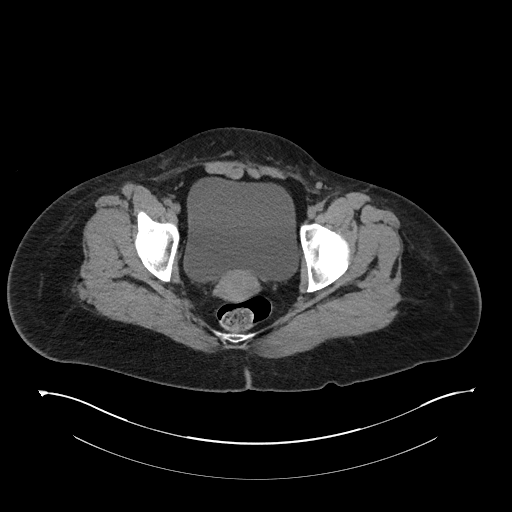
[im 29/98  soft-tissue]
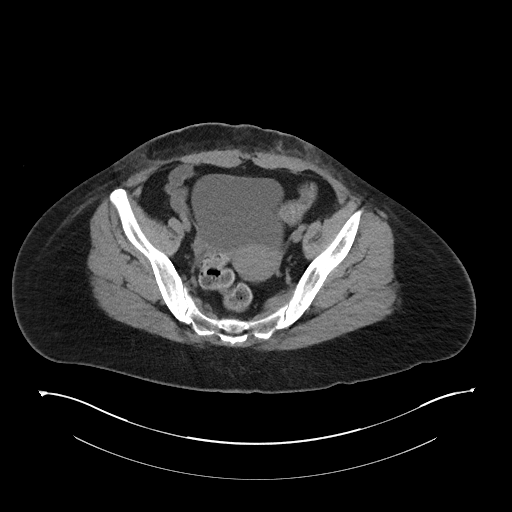
[im 33/98  soft-tissue]
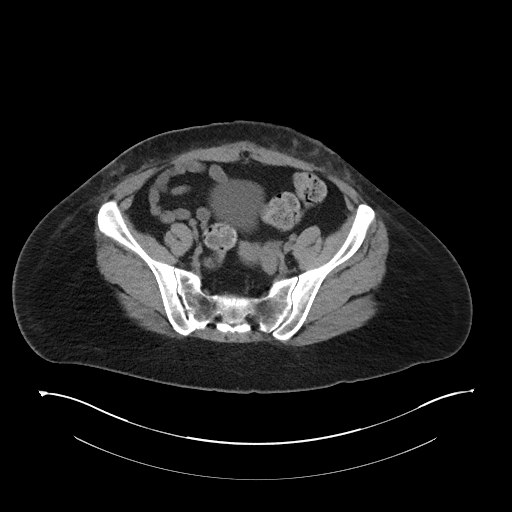
[im 41/98  soft-tissue]
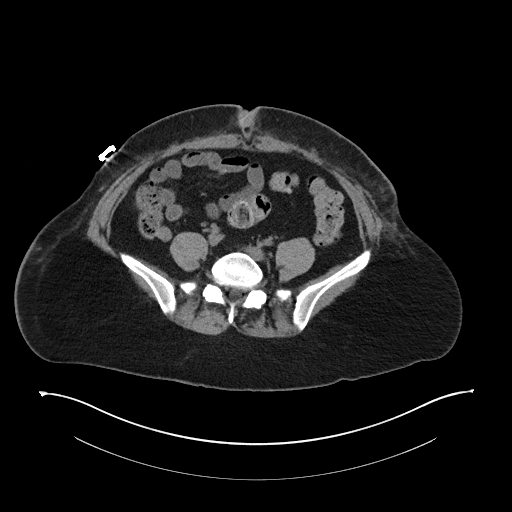
[im 49/98  soft-tissue]
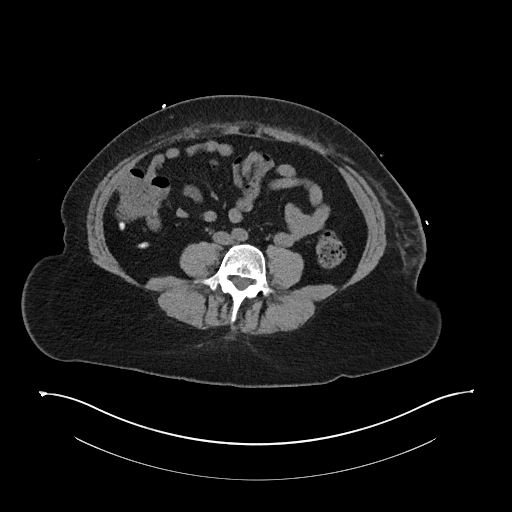
[im 57/98  soft-tissue]
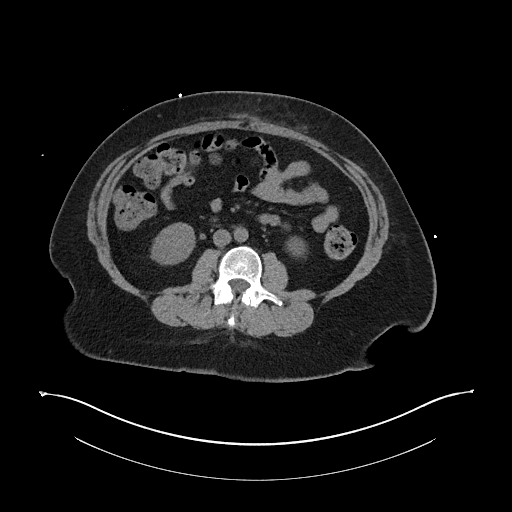
[im 65/98  soft-tissue]
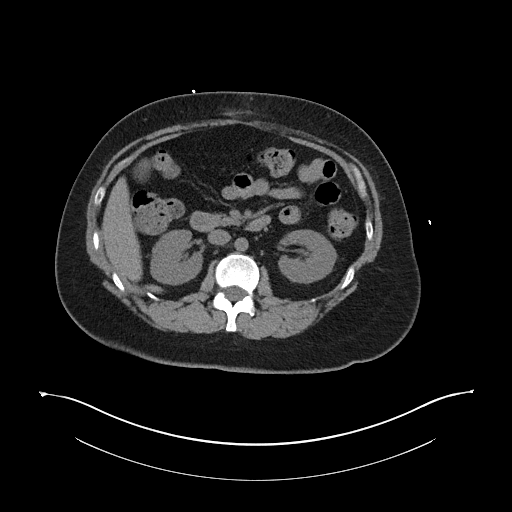
[im 65/98  bone]
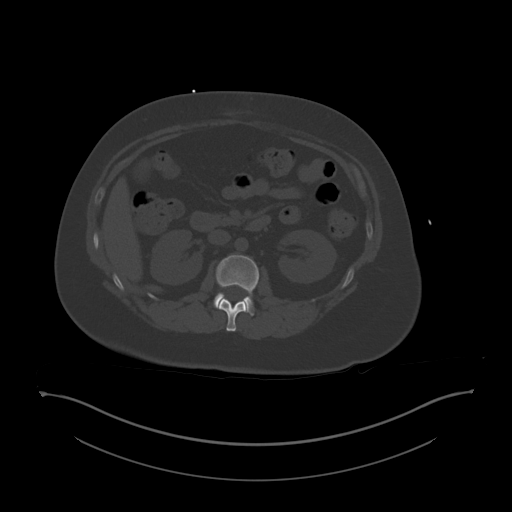
[im 69/98  soft-tissue]
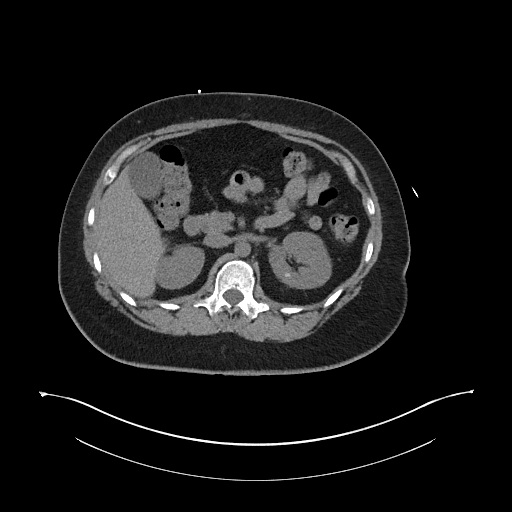
[im 77/98  soft-tissue]
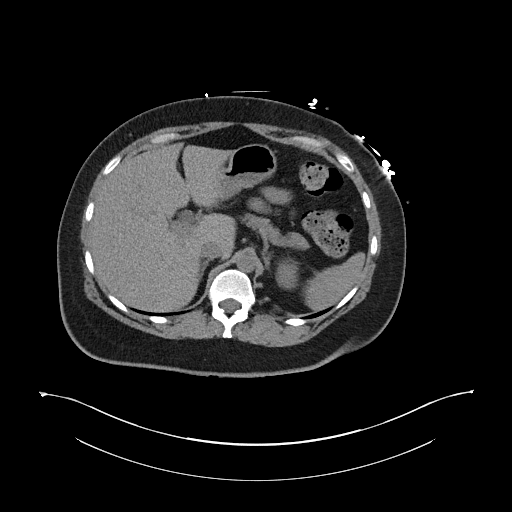
[im 85/98  soft-tissue]
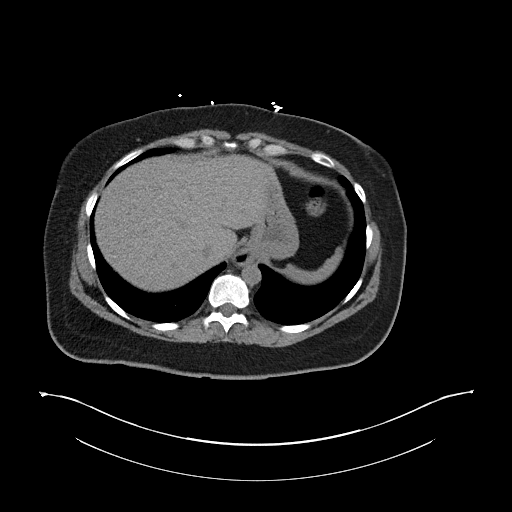
[im 93/98  soft-tissue]
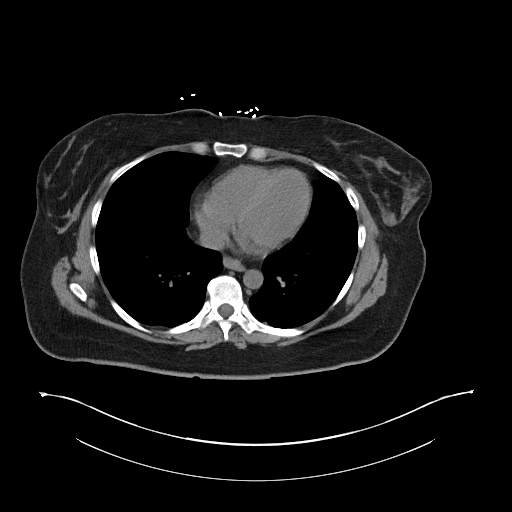

[Series 6: cor st · coronal · 0.91mm/px · 3 of 104 slices shown]
[im 35/104  soft-tissue]
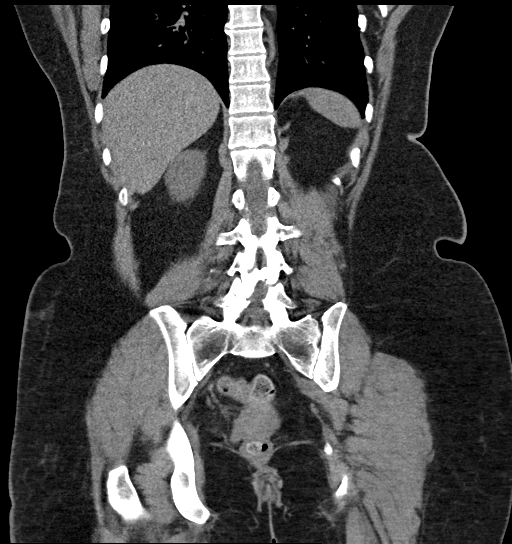
[im 46/104  soft-tissue]
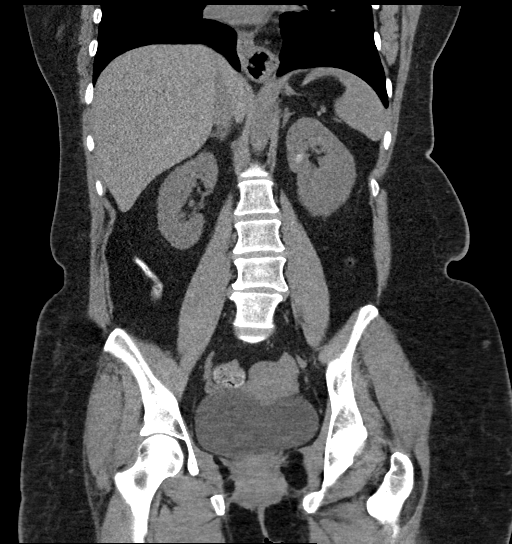
[im 58/104  soft-tissue]
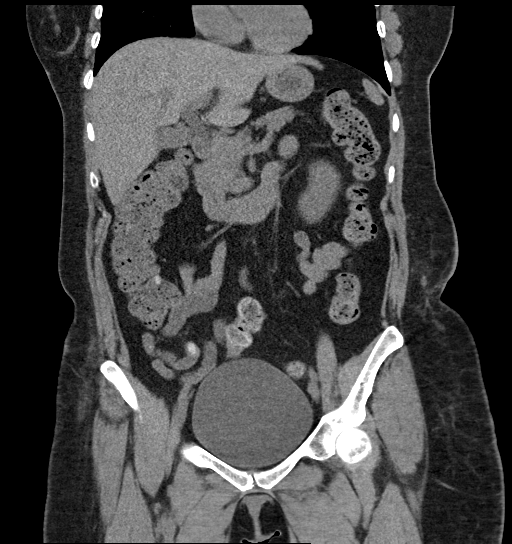

[16 of 46 positions shown; findings below may reference images not displayed]

FINDINGS: Lower chest: The lung bases are clear. Small esophageal hiatal
hernia.

Hepatobiliary: No focal liver abnormality is seen. No gallstones,
gallbladder wall thickening, or biliary dilatation.

Pancreas: Unremarkable. No pancreatic ductal dilatation or
surrounding inflammatory changes.

Spleen: Normal in size without focal abnormality.

Adrenals/Urinary Tract: No adrenal gland nodules. Parenchymal stones
in the left kidney. No hydronephrosis or hydroureter. No ureteral
stones identified. Bladder is unremarkable.

Stomach/Bowel: Stomach, small bowel, and colon are not abnormally
distended. Stool fills the colon. No inflammatory changes are
appreciated. Appendix is normal.

Vascular/Lymphatic: No significant vascular findings are present. No
enlarged abdominal or pelvic lymph nodes.

Reproductive: Uterus and bilateral adnexa are unremarkable.

Other: No free air or free fluid in the abdomen. Infiltration in the
anterior abdominal subcutaneous fat with surface irregularity. This
is likely postoperative edema but could reflect cellulitis in the
appropriate clinical setting. No loculated collection is identified.
Minimal subcutaneous gas.

Musculoskeletal: No acute or significant osseous findings.
IMPRESSION: 1. Infiltration in the subcutaneous fat of the anterior abdominal
wall, most likely postoperative. No loculated collections
identified.
2. No evidence of bowel obstruction or inflammation.
3. Nonobstructing left renal stones.
4. Small esophageal hiatal hernia.
